# Patient Record
Sex: Female | Born: 1985
Health system: Southern US, Community
[De-identification: ages and names within clinical notes are randomized; demographics above are authoritative.]

## PROBLEM LIST (undated history)

## (undated) DIAGNOSIS — M311 Thrombotic microangiopathy: Secondary | ICD-10-CM

## (undated) DIAGNOSIS — M3119 Other thrombotic microangiopathy: Secondary | ICD-10-CM

## (undated) DIAGNOSIS — D593 Hemolytic-uremic syndrome: Secondary | ICD-10-CM

## (undated) DIAGNOSIS — D500 Iron deficiency anemia secondary to blood loss (chronic): Secondary | ICD-10-CM

## (undated) DIAGNOSIS — I10 Essential (primary) hypertension: Secondary | ICD-10-CM

## (undated) DIAGNOSIS — N289 Disorder of kidney and ureter, unspecified: Secondary | ICD-10-CM

## (undated) DIAGNOSIS — D5932 Hereditary hemolytic-uremic syndrome: Secondary | ICD-10-CM

## (undated) DIAGNOSIS — G9389 Other specified disorders of brain: Secondary | ICD-10-CM

## (undated) HISTORY — DX: Hereditary hemolytic-uremic syndrome: D59.32

## (undated) HISTORY — DX: Hemolytic-uremic syndrome: D59.3

---

## 2013-05-21 ENCOUNTER — Ambulatory Visit: Payer: Medicaid Other | Attending: Family Medicine | Admitting: Internal Medicine

## 2013-05-21 VITALS — BP 142/93 | HR 56 | Temp 98.5°F | Ht 64.0 in | Wt 150.1 lb

## 2013-05-21 DIAGNOSIS — M311 Thrombotic microangiopathy: Secondary | ICD-10-CM

## 2013-05-21 DIAGNOSIS — M3119 Other thrombotic microangiopathy: Secondary | ICD-10-CM | POA: Insufficient documentation

## 2013-05-21 DIAGNOSIS — I10 Essential (primary) hypertension: Secondary | ICD-10-CM | POA: Insufficient documentation

## 2013-05-21 LAB — CBC WITH DIFFERENTIAL/PLATELET
Basophils Absolute: 0 10*3/uL (ref 0.0–0.1)
Basophils Relative: 1 % (ref 0–1)
Hemoglobin: 13.5 g/dL (ref 12.0–15.0)
Lymphocytes Relative: 33 % (ref 12–46)
MCHC: 34.8 g/dL (ref 30.0–36.0)
Monocytes Relative: 7 % (ref 3–12)
Neutro Abs: 3.2 10*3/uL (ref 1.7–7.7)
Neutrophils Relative %: 54 % (ref 43–77)
RDW: 13.2 % (ref 11.5–15.5)
WBC: 5.9 10*3/uL (ref 4.0–10.5)

## 2013-05-21 MED ORDER — CARVEDILOL 12.5 MG PO TABS: 12.5000 mg | ORAL_TABLET | Freq: Two times a day (BID) | ORAL | Status: DC

## 2013-05-21 NOTE — Patient Instructions (Addendum)

## 2013-05-21 NOTE — Progress Notes (Signed)
Patient ID: Jenna Duran, female   DOB: 01-10-1986, 27 y.o.   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 Patient usually gets twice a year CBC check. At this time patient has no current complaints. No chest pain, shortness of breath or palpitations. No headaches. No abdominal pain, nausea or vomiting. No lightheadedness or dizziness or loss of consciousness.  No Known Allergies No past medical history on file. No current outpatient prescriptions on file prior to visit.   No current facility-administered medications on file prior to visit.   Family medical history significant for HTN, HLD  History   Social History  . Marital Status: Significant Other    Spouse Name: N/A    Number of Children: N/A  . Years of Education: N/A   Occupational History  . Not on file.   Social History Main Topics  . Smoking status: Never Smoker   . Smokeless tobacco: Not on file  . Alcohol Use: Not on file  . Drug Use: Not on file  . Sexually Active: Not on file   Other Topics Concern  . Not on file   Social History Narrative  . No narrative on file    Review of Systems  Constitutional: Negative for fever, chills, diaphoresis, activity change, appetite change and fatigue.  HENT: Negative for ear pain, nosebleeds, congestion, facial swelling, rhinorrhea, neck pain, neck stiffness and ear discharge.   Eyes: Negative for pain, discharge, redness, itching and visual disturbance.  Respiratory: Negative for cough, choking, chest tightness, shortness of breath, wheezing and stridor.   Cardiovascular: Negative for chest pain, palpitations and leg swelling.  Gastrointestinal: Negative for abdominal distention.  Genitourinary: Negative for dysuria, urgency, frequency, hematuria, flank pain, decreased urine volume, difficulty urinating and dyspareunia.  Musculoskeletal: Negative for back pain, joint  swelling, arthralgias and gait problem.  Neurological: Negative for dizziness, tremors, seizures, syncope, facial asymmetry, speech difficulty, weakness, light-headedness, numbness and headaches.  Hematological: Negative for adenopathy. Does not bruise/bleed easily.  Psychiatric/Behavioral: Negative for hallucinations, behavioral problems, confusion, dysphoric mood, decreased concentration and agitation.    Objective:   Filed Vitals:   05/21/13 1226  BP: 142/93  Pulse: 56  Temp: 98.5 F (36.9 C)    Physical Exam  Constitutional: Appears well-developed and well-nourished. No distress.  HENT: Normocephalic. External right and left ear normal. Oropharynx is clear and moist.  Eyes: Conjunctivae and EOM are normal. PERRLA, no scleral icterus.  Neck: Normal ROM. Neck supple. No JVD. No tracheal deviation. No thyromegaly.  CVS: RRR, S1/S2 +, no murmurs, no gallops, no carotid bruit.  Pulmonary: Effort and breath sounds normal, no stridor, rhonchi, wheezes, rales.  Abdominal: Soft. BS +,  no distension, tenderness, rebound or guarding.  Musculoskeletal: Normal range of motion. No edema and no tenderness.  Lymphadenopathy: No lymphadenopathy noted, cervical, inguinal. Neuro: Alert. Normal reflexes, muscle tone coordination. No cranial nerve deficit. Skin: Skin is warm and dry. No rash noted. Not diaphoretic. No erythema. No pallor.  Psychiatric: Normal mood and affect. Behavior, judgment, thought content normal.   No results found for this basename: WBC, HGB, HCT, MCV, PLT   No results found for this basename: CREATININE, BUN, NA, K, CL, CO2    No results found for this basename: HGBA1C   Lipid Panel  No results found for this basename: chol, trig, hdl, cholhdl, vldl, ldlcalc       Assessment and plan:   Patient  Active Problem List   Diagnosis Date Noted  . 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 Patient is not on active  treatment for TTP.  Marland Kitchen HTN (hypertension) 05/21/2013    Priority: High - BP 142/93 - Description provided for Coreg 12.5 mg twice daily which is patient's medication on a regular basis

## 2013-10-08 ENCOUNTER — Other Ambulatory Visit: Payer: Self-pay

## 2016-01-09 DIAGNOSIS — R51 Headache: Secondary | ICD-10-CM

## 2016-01-09 DIAGNOSIS — R519 Headache, unspecified: Secondary | ICD-10-CM | POA: Insufficient documentation

## 2016-03-03 ENCOUNTER — Emergency Department (HOSPITAL_COMMUNITY)
Admission: EM | Admit: 2016-03-03 | Discharge: 2016-03-03 | Disposition: A | Discharge status: Home / Self Care | Payer: Medicaid Other | Attending: Emergency Medicine | Admitting: Emergency Medicine

## 2016-03-03 ENCOUNTER — Encounter (HOSPITAL_COMMUNITY): Payer: Self-pay | Admitting: Nurse Practitioner

## 2016-03-03 DIAGNOSIS — I10 Essential (primary) hypertension: Secondary | ICD-10-CM | POA: Insufficient documentation

## 2016-03-03 DIAGNOSIS — Z87448 Personal history of other diseases of urinary system: Secondary | ICD-10-CM | POA: Insufficient documentation

## 2016-03-03 DIAGNOSIS — Z8739 Personal history of other diseases of the musculoskeletal system and connective tissue: Secondary | ICD-10-CM | POA: Insufficient documentation

## 2016-03-03 DIAGNOSIS — IMO0001 Reserved for inherently not codable concepts without codable children: Secondary | ICD-10-CM

## 2016-03-03 DIAGNOSIS — R030 Elevated blood-pressure reading, without diagnosis of hypertension: Secondary | ICD-10-CM

## 2016-03-03 DIAGNOSIS — F17210 Nicotine dependence, cigarettes, uncomplicated: Secondary | ICD-10-CM | POA: Insufficient documentation

## 2016-03-03 DIAGNOSIS — Z79899 Other long term (current) drug therapy: Secondary | ICD-10-CM | POA: Insufficient documentation

## 2016-03-03 HISTORY — DX: Thrombotic microangiopathy: M31.1

## 2016-03-03 HISTORY — DX: Disorder of kidney and ureter, unspecified: N28.9

## 2016-03-03 HISTORY — DX: Essential (primary) hypertension: I10

## 2016-03-03 HISTORY — DX: Other thrombotic microangiopathy: M31.19

## 2016-03-03 MED ORDER — LABETALOL HCL 300 MG PO TABS: 300.0000 mg | ORAL_TABLET | Freq: Two times a day (BID) | ORAL | Status: DC

## 2016-03-03 MED ORDER — CLONIDINE HCL 0.2 MG PO TABS
0.2000 mg | ORAL_TABLET | Freq: Once | ORAL | Status: AC
  Administered 2016-03-03: 0.2 mg via ORAL
  Filled 2016-03-03: qty 1

## 2016-03-03 MED ORDER — AMLODIPINE BESYLATE 5 MG PO TABS
10.0000 mg | ORAL_TABLET | Freq: Once | ORAL | Status: AC
  Administered 2016-03-03: 10 mg via ORAL
  Filled 2016-03-03: qty 2

## 2016-03-03 MED ORDER — LABETALOL HCL 200 MG PO TABS
300.0000 mg | ORAL_TABLET | Freq: Once | ORAL | Status: AC
  Administered 2016-03-03: 300 mg via ORAL
  Filled 2016-03-03: qty 2

## 2016-03-03 MED ORDER — CLONIDINE HCL 0.2 MG PO TABS: 0.2000 mg | ORAL_TABLET | Freq: Three times a day (TID) | ORAL | Status: DC

## 2016-03-03 MED ORDER — AMLODIPINE BESYLATE 10 MG PO TABS: 10.0000 mg | ORAL_TABLET | Freq: Every day | ORAL | Status: DC

## 2016-03-03 NOTE — ED Notes (Signed)
Pt is in stable condition upon d/c and ambulates from ED. 

## 2016-03-03 NOTE — Discharge Instructions (Signed)
Hypertension Hypertension, commonly called high blood pressure, is when the force of blood pumping through your arteries is too strong. Your arteries are the blood vessels that carry blood from your heart throughout your body. A blood pressure reading consists of a higher number over a lower number, such as 110/72. The higher number (systolic) is the pressure inside your arteries when your heart pumps. The lower number (diastolic) is the pressure inside your arteries when your heart relaxes. Ideally you want your blood pressure below 120/80. Hypertension forces your heart to work harder to pump blood. Your arteries may become narrow or stiff. Having untreated or uncontrolled hypertension can cause heart attack, stroke, kidney disease, and other problems. RISK FACTORS Some risk factors for high blood pressure are controllable. Others are not.  Risk factors you cannot control include:   Race. You may be at higher risk if you are African American.  Age. Risk increases with age.  Gender. Men are at higher risk than women before age 45 years. After age 65, women are at higher risk than men. Risk factors you can control include:  Not getting enough exercise or physical activity.  Being overweight.  Getting too much fat, sugar, calories, or salt in your diet.  Drinking too much alcohol. SIGNS AND SYMPTOMS Hypertension does not usually cause signs or symptoms. Extremely high blood pressure (hypertensive crisis) may cause headache, anxiety, shortness of breath, and nosebleed. DIAGNOSIS To check if you have hypertension, your health care provider will measure your blood pressure while you are seated, with your arm held at the level of your heart. It should be measured at least twice using the same arm. Certain conditions can cause a difference in blood pressure between your right and left arms. A blood pressure reading that is higher than normal on one occasion does not mean that you need treatment. If  it is not clear whether you have high blood pressure, you may be asked to return on a different day to have your blood pressure checked again. Or, you may be asked to monitor your blood pressure at home for 1 or more weeks. TREATMENT Treating high blood pressure includes making lifestyle changes and possibly taking medicine. Living a healthy lifestyle can help lower high blood pressure. You may need to change some of your habits. Lifestyle changes may include:  Following the DASH diet. This diet is high in fruits, vegetables, and whole grains. It is low in salt, red meat, and added sugars.  Keep your sodium intake below 2,300 mg per day.  Getting at least 30-45 minutes of aerobic exercise at least 4 times per week.  Losing weight if necessary.  Not smoking.  Limiting alcoholic beverages.  Learning ways to reduce stress. Your health care provider may prescribe medicine if lifestyle changes are not enough to get your blood pressure under control, and if one of the following is true:  You are 18-59 years of age and your systolic blood pressure is above 140.  You are 60 years of age or older, and your systolic blood pressure is above 150.  Your diastolic blood pressure is above 90.  You have diabetes, and your systolic blood pressure is over 140 or your diastolic blood pressure is over 90.  You have kidney disease and your blood pressure is above 140/90.  You have heart disease and your blood pressure is above 140/90. Your personal target blood pressure may vary depending on your medical conditions, your age, and other factors. HOME CARE INSTRUCTIONS    Have your blood pressure rechecked as directed by your health care provider.   Take medicines only as directed by your health care provider. Follow the directions carefully. Blood pressure medicines must be taken as prescribed. The medicine does not work as well when you skip doses. Skipping doses also puts you at risk for  problems.  Do not smoke.   Monitor your blood pressure at home as directed by your health care provider. SEEK MEDICAL CARE IF:   You think you are having a reaction to medicines taken.  You have recurrent headaches or feel dizzy.  You have swelling in your ankles.  You have trouble with your vision. SEEK IMMEDIATE MEDICAL CARE IF:  You develop a severe headache or confusion.  You have unusual weakness, numbness, or feel faint.  You have severe chest or abdominal pain.  You vomit repeatedly.  You have trouble breathing. MAKE SURE YOU:   Understand these instructions.  Will watch your condition.  Will get help right away if you are not doing well or get worse.   This information is not intended to replace advice given to you by your health care provider. Make sure you discuss any questions you have with your health care provider.   Document Released: 11/19/2005 Document Revised: 04/05/2015 Document Reviewed: 09/11/2013 Elsevier Interactive Patient Education 2016 Elsevier Inc.  

## 2016-03-03 NOTE — ED Provider Notes (Signed)
CSN: [id number]     Arrival date & time 03/03/16  1157 History   First MD Initiated Contact with Patient 03/03/16 1226     Chief Complaint  Patient presents with  . Hypertension     (Consider location/radiation/quality/duration/timing/severity/associated sxs/prior Treatment) HPI   Jenna Duran is a 30 y.o. female with PMH significant for HTN who presents with elevated BP because she has been out of her BP medications for the past couple days.  She reports she is currently in the process of getting Medicaid, and has not been able to follow up with a PCP.  She states she takes 0.2 mg Clonidine TID, Labetalol 300 mg BID, and Amlodipine 10 mg QD.  She reports generalized mild headache with gradual onset since being out of her medications.  Denies thunderclap headache. Denies CP, SOB, abdominal pain, syncope, or weakness.    Past Medical History  Diagnosis Date  . Hypertension   . Renal disorder   . T.T.P. syndrome ([place])    No past surgical history on file. No family history on file. Social History  Substance Use Topics  . Smoking status: Current Every Day Smoker    Types: Cigarettes  . Smokeless tobacco: Not on file  . Alcohol Use: No   OB History    No data available     Review of Systems All other systems negative unless otherwise stated in HPI    Allergies  Review of patient's allergies indicates no known allergies.  Home Medications   Prior to Admission medications   Medication Sig Start Date End Date Taking? Authorizing Provider  carvedilol (COREG) 12.5 MG tablet Take 1 tablet (12.5 mg total) by mouth 2 (two) times daily with a meal. 05/21/13   Robbie Lis, MD   BP 155/100 mmHg  Pulse 82  Temp(Src) 97.4 F (36.3 C) (Oral)  Resp 16  SpO2 100%  LMP 02/26/2016 Physical Exam  Constitutional: She is oriented to person, place, and time. She appears well-developed and well-nourished.  Non-toxic appearance. She does not have a sickly appearance. She does not appear  ill.  HENT:  Head: Normocephalic and atraumatic.  Mouth/Throat: Oropharynx is clear and moist.  Eyes: Conjunctivae are normal. Pupils are equal, round, and reactive to light.  Neck: Normal range of motion. Neck supple.  Cardiovascular: Normal rate, regular rhythm and normal heart sounds.   No murmur heard. Pulmonary/Chest: Effort normal and breath sounds normal. No accessory muscle usage or stridor. No respiratory distress. She has no wheezes. She has no rhonchi. She has no rales.  Abdominal: Soft. Bowel sounds are normal. She exhibits no distension. There is no tenderness.  Musculoskeletal: Normal range of motion.  Lymphadenopathy:    She has no cervical adenopathy.  Neurological: She is alert and oriented to person, place, and time.  Mental Status:   AOx3.  Speech clear without dysarthria. Cranial Nerves:  I-not tested  II-PERRLA  III, IV, VI-EOMs intact  V-temporal and masseter strength intact  VII-symmetrical facial movements intact, no facial droop  VIII-hearing grossly intact bilaterally  IX, X-gag intact  XI-strength of sternomastoid and trapezius muscles 5/5  XII-tongue midline Motor:   Good muscle bulk and tone  Strength 5/5 bilaterally in upper and lower extremities   Cerebellar--intact RAMs, finger to nose intact   No pronator drift Sensory:  Intact in upper and lower extremities   Skin: Skin is warm and dry.  Psychiatric: She has a normal mood and affect. Her behavior is normal.    ED  Course  Procedures (including critical care time) Labs Review Labs Reviewed - No data to display  Imaging Review No results found. I have personally reviewed and evaluated these images and lab results as part of my medical decision-making.   EKG Interpretation None      MDM   Final diagnoses:  Essential hypertension  Elevated BP   Patient presents for medication refill for HTN currently taking Clonidine, Labetalol, and Amlodipine.  On arrival, BP elevated 155/100.   Denies severe HA, CP, SOB, or abdominal pain.  Normal neuro exam.  Doubt ACS, SAH, or other hypertensive emergency. Patient given AM doses in ED.  BP improved to 137/96.  Per ACEP guidelines, no indication for emergent intervention at this time.  Plan to refill prescriptions and given follow up with [facility].  Discussed return precautions.  Patient agrees and acknowledges the above plan for discharge.   Case has been discussed with Dr. Audie Pinto who agrees with the above plan for discharge.      Gloriann Loan, PA-C 03/03/16 [place], MD 03/08/16 [phone]

## 2016-03-03 NOTE — ED Notes (Signed)
She reports she is out of her BP medication due to financial issues and this am her BP was elevated and she doesn't feel well and has had a headache. She is A&Ox4, resp e/u

## 2016-03-03 NOTE — ED Notes (Signed)
Pt states she is only here for a medication refill on her BP meds. Pt states she doesn't have a PCP and needs a 30 day supply until she can establish primary care.

## 2016-04-18 ENCOUNTER — Encounter (HOSPITAL_COMMUNITY): Payer: Self-pay | Admitting: Emergency Medicine

## 2016-04-18 ENCOUNTER — Emergency Department (HOSPITAL_COMMUNITY): Payer: Medicaid Other

## 2016-04-18 ENCOUNTER — Emergency Department (HOSPITAL_COMMUNITY)
Admission: EM | Admit: 2016-04-18 | Discharge: 2016-04-18 | Disposition: A | Discharge status: Home / Self Care | Payer: Medicaid Other | Attending: Emergency Medicine | Admitting: Emergency Medicine

## 2016-04-18 DIAGNOSIS — R10819 Abdominal tenderness, unspecified site: Secondary | ICD-10-CM | POA: Insufficient documentation

## 2016-04-18 DIAGNOSIS — Z760 Encounter for issue of repeat prescription: Secondary | ICD-10-CM | POA: Insufficient documentation

## 2016-04-18 DIAGNOSIS — I159 Secondary hypertension, unspecified: Secondary | ICD-10-CM

## 2016-04-18 DIAGNOSIS — R0789 Other chest pain: Secondary | ICD-10-CM | POA: Insufficient documentation

## 2016-04-18 DIAGNOSIS — Z79899 Other long term (current) drug therapy: Secondary | ICD-10-CM | POA: Insufficient documentation

## 2016-04-18 DIAGNOSIS — F17210 Nicotine dependence, cigarettes, uncomplicated: Secondary | ICD-10-CM | POA: Insufficient documentation

## 2016-04-18 MED ORDER — CLONIDINE HCL 0.2 MG PO TABS
0.2000 mg | ORAL_TABLET | Freq: Once | ORAL | Status: AC
  Administered 2016-04-18: 0.2 mg via ORAL
  Filled 2016-04-18: qty 1

## 2016-04-18 MED ORDER — CLONIDINE HCL 0.2 MG PO TABS: 0.2000 mg | ORAL_TABLET | Freq: Three times a day (TID) | ORAL | Status: DC

## 2016-04-18 MED ORDER — LABETALOL HCL 200 MG PO TABS
300.0000 mg | ORAL_TABLET | Freq: Once | ORAL | Status: AC
  Administered 2016-04-18: 300 mg via ORAL
  Filled 2016-04-18: qty 2

## 2016-04-18 MED ORDER — LABETALOL HCL 300 MG PO TABS: 300.0000 mg | ORAL_TABLET | Freq: Two times a day (BID) | ORAL | Status: DC

## 2016-04-18 NOTE — ED Notes (Addendum)
Pt reports she ran out of her 0.2mg  clonidine and 300mg  labetelol yesterday. Pt reports hx of HTn. States this Am at 2am woke up with substernal chest pain and tightness feeling like she couldn't breathe. States chest pain has decreased but still feels tightness and has a headache. Pt awake, alert, oriented x4, NAD at present. Lungs CTA.

## 2016-04-18 NOTE — Discharge Instructions (Signed)
Take 4 over the counter ibuprofen tablets 3 times a day or 2 over-the-counter naproxen tablets twice a day for pain. Try tylenol 1-2 caps 4 times a day.  Chest Wall Pain Chest wall pain is pain in or around the bones and muscles of your chest. Sometimes, an injury causes this pain. Sometimes, the cause may not be known. This pain may take several weeks or longer to get better. HOME CARE Pay attention to any changes in your symptoms. Take these actions to help with your pain:  Rest as told by your doctor.  Avoid activities that cause pain. Try not to use your chest, belly (abdominal), or side muscles to lift heavy things.  If directed, apply ice to the painful area:  Put ice in a plastic bag.  Place a towel between your skin and the bag.  Leave the ice on for 20 minutes, 2-3 times per day.  Take over-the-counter and prescription medicines only as told by your doctor.  Do not use tobacco products, including cigarettes, chewing tobacco, and e-cigarettes. If you need help quitting, ask your doctor.  Keep all follow-up visits as told by your doctor. This is important. GET HELP IF:  You have a fever.  Your chest pain gets worse.  You have new symptoms. GET HELP RIGHT AWAY IF:  You feel sick to your stomach (nauseous) or you throw up (vomit).  You feel sweaty or light-headed.  You have a cough with phlegm (sputum) or you cough up blood.  You are short of breath.   This information is not intended to replace advice given to you by your health care provider. Make sure you discuss any questions you have with your health care provider.   Document Released: 05/07/2008 Document Revised: 08/10/2015 Document Reviewed: 02/14/2015 Elsevier Interactive Patient Education [employer].

## 2016-04-18 NOTE — ED Provider Notes (Signed)
CSN: [id number]     Arrival date & time 04/18/16  0845 History   First MD Initiated Contact with Patient 04/18/16 [phone]     Chief Complaint  Patient presents with  . Medication Refill  . Chest Pain     (Consider location/radiation/quality/duration/timing/severity/associated sxs/prior Treatment) Patient is a 30 y.o. female presenting with chest pain. The history is provided by the patient.  Chest Pain Pain location:  R chest and R lateral chest Pain quality: dull and pressure   Pain radiates to:  Does not radiate Pain severity:  Mild Onset quality:  Gradual Duration:  2 days Timing:  Constant Progression:  Unchanged Chronicity:  New Relieved by:  Certain positions Worsened by:  Deep breathing, movement and certain positions Ineffective treatments:  None tried Associated symptoms: no dizziness, no fever, no headache, no nausea, no palpitations, no shortness of breath and not vomiting   Risk factors: hypertension    30 yo F With a chief complaint of chest pain. This is to her right lateral chest wall worse with movement or palpation. Going on since last night. Patient is concerned because it coincides with when she ran out of her home blood pressure medications. She is currently working on establishing with the family doctor however is waiting for her social worker to get her insurance approved. Denies exertional symptoms. Denies relation with food. Denies cough congestion fevers. Denies PE risk factors.  Past Medical History  Diagnosis Date  . Hypertension   . Renal disorder   . T.T.P. syndrome ([place])    History reviewed. No pertinent past surgical history. History reviewed. No pertinent family history. Social History  Substance Use Topics  . Smoking status: Current Every Day Smoker    Types: Cigarettes  . Smokeless tobacco: None  . Alcohol Use: No   OB History    No data available     Review of Systems  Constitutional: Negative for fever and chills.  HENT: Negative for  congestion and rhinorrhea.   Eyes: Negative for redness and visual disturbance.  Respiratory: Negative for shortness of breath and wheezing.   Cardiovascular: Positive for chest pain. Negative for palpitations.  Gastrointestinal: Negative for nausea and vomiting.  Genitourinary: Negative for dysuria and urgency.  Musculoskeletal: Negative for myalgias and arthralgias.  Skin: Negative for pallor and wound.  Neurological: Negative for dizziness and headaches.      Allergies  Review of patient's allergies indicates no known allergies.  Home Medications   Prior to Admission medications   Medication Sig Start Date End Date Taking? Authorizing Provider  amLODipine (NORVASC) 10 MG tablet Take 1 tablet (10 mg total) by mouth daily. 03/03/16  Yes Kayla Rose, PA-C  carvedilol (COREG) 12.5 MG tablet Take 1 tablet (12.5 mg total) by mouth 2 (two) times daily with a meal. 05/21/13  Yes Robbie Lis, MD  cloNIDine (CATAPRES) 0.2 MG tablet Take 1 tablet (0.2 mg total) by mouth 3 (three) times daily. 04/18/16   Deno Etienne, DO  labetalol (NORMODYNE) 300 MG tablet Take 1 tablet (300 mg total) by mouth 2 (two) times daily. 04/18/16   Deno Etienne, DO   BP 172/118 mmHg  Pulse 62  Temp(Src) 98.5 F (36.9 C) (Oral)  Resp 16  SpO2 100%  LMP 04/02/2016 Physical Exam  Constitutional: She is oriented to person, place, and time. She appears well-developed and well-nourished. No distress.  HENT:  Head: Normocephalic and atraumatic.  Eyes: EOM are normal. Pupils are equal, round, and reactive to light.  Neck:  Normal range of motion. Neck supple.  Cardiovascular: Normal rate and regular rhythm.  Exam reveals no gallop and no friction rub.   No murmur heard. Pulmonary/Chest: Effort normal. She has no wheezes. She has no rales. She exhibits tenderness (tender palpation about the right anterior lateral chest wall. Some mild tenderness in the upper portion of the abdomen. Negative Murphy sign).  Abdominal: Soft. She  exhibits no distension. There is tenderness. There is no rebound and no guarding.  Musculoskeletal: She exhibits no edema or tenderness.  Neurological: She is alert and oriented to person, place, and time.  Skin: Skin is warm and dry. She is not diaphoretic.  Psychiatric: She has a normal mood and affect. Her behavior is normal.  Nursing note and vitals reviewed.   ED Course  Procedures (including critical care time) Labs Review Labs Reviewed - No data to display  Imaging Review Dg Chest 2 View  04/18/2016  CLINICAL DATA:  Chest pain. EXAM: CHEST  2 VIEW COMPARISON:  None. FINDINGS: Normal heart size and mediastinal contours. No acute infiltrate or edema. No effusion or pneumothorax. No acute osseous findings. IMPRESSION: Negative chest. Electronically Signed   By: Monte Fantasia M.D.   On: 04/18/2016 09:27   I have personally reviewed and evaluated these images and lab results as part of my medical decision-making.   EKG Interpretation   Date/Time:  Wednesday Apr 18 2016 08:52:45 EDT Ventricular Rate:  78 PR Interval:  138 QRS Duration: 84 QT Interval:  393 QTC Calculation: 448 R Axis:   74 Text Interpretation:  Sinus rhythm Right atrial enlargement No old tracing  to compare Confirmed by Jill Stopka MD, DANIEL [phone]) on 04/18/2016 9:05:15 AM  Also confirmed by Tyrone Nine MD, DANIEL [id number]), editor [place] CT, Leda Gauze  [phone])  on 04/18/2016 9:16:55 AM      MDM   Final diagnoses:  Chest wall pain  Secondary hypertension, unspecified    30 yo F with a chief complaint of right-sided chest wall pain. Completely atypical for ACS. Pain with palpation on the chest wall worse with twisting. In my conversation with the patient she seems really more concerned about repeat prescription for her blood pressure medication. We'll give her a dose now prescribe her a 30 day course. Given follow-[facility] with [facility]    I have discussed the  diagnosis/risks/treatment options with the patient and family and believe the pt to be eligible for discharge home to follow-up with PCP. We also discussed returning to the ED immediately if new or worsening sx occur. We discussed the sx which are most concerning (e.g., sudden worsening pain, fever, inability to tolerate by mouth) that necessitate immediate return. Medications administered to the patient during their visit and any new prescriptions provided to the patient are listed below.  Medications given during this visit Medications  cloNIDine (CATAPRES) tablet 0.2 mg (0.2 mg Oral Given 04/18/16 0931)  labetalol (NORMODYNE) tablet 300 mg (300 mg Oral Given 04/18/16 0931)    Discharge Medication List as of 04/18/2016  9:52 AM      The patient appears reasonably screen and/or stabilized for discharge and I doubt any other medical condition or other [facility] requiring further screening, evaluation, or treatment in the ED at this time prior to discharge.    Deno Etienne, DO 04/18/16 1541

## 2016-06-26 ENCOUNTER — Emergency Department (HOSPITAL_COMMUNITY)
Admission: EM | Admit: 2016-06-26 | Discharge: 2016-06-26 | Disposition: A | Discharge status: Home / Self Care | Payer: Medicaid Other | Attending: Emergency Medicine | Admitting: Emergency Medicine

## 2016-06-26 ENCOUNTER — Encounter (HOSPITAL_COMMUNITY): Payer: Self-pay | Admitting: Emergency Medicine

## 2016-06-26 DIAGNOSIS — I10 Essential (primary) hypertension: Secondary | ICD-10-CM | POA: Insufficient documentation

## 2016-06-26 DIAGNOSIS — Z79899 Other long term (current) drug therapy: Secondary | ICD-10-CM | POA: Insufficient documentation

## 2016-06-26 DIAGNOSIS — F17210 Nicotine dependence, cigarettes, uncomplicated: Secondary | ICD-10-CM | POA: Insufficient documentation

## 2016-06-26 DIAGNOSIS — Z760 Encounter for issue of repeat prescription: Secondary | ICD-10-CM | POA: Insufficient documentation

## 2016-06-26 MED ORDER — CLONIDINE HCL 0.2 MG PO TABS: 0.2000 mg | ORAL_TABLET | Freq: Two times a day (BID) | ORAL | 0 refills | Status: DC

## 2016-06-26 MED ORDER — AMLODIPINE BESYLATE 10 MG PO TABS: 10.0000 mg | ORAL_TABLET | Freq: Every day | ORAL | 0 refills | Status: DC

## 2016-06-26 MED ORDER — CLONIDINE HCL 0.1 MG PO TABS: 0.2000 mg | ORAL_TABLET | Freq: Three times a day (TID) | ORAL | Status: DC

## 2016-06-26 MED ORDER — LABETALOL HCL 200 MG PO TABS
300.0000 mg | ORAL_TABLET | Freq: Two times a day (BID) | ORAL | Status: DC
  Administered 2016-06-26: 300 mg via ORAL
  Filled 2016-06-26: qty 2

## 2016-06-26 MED ORDER — LABETALOL HCL 300 MG PO TABS: 300.0000 mg | ORAL_TABLET | Freq: Two times a day (BID) | ORAL | 0 refills | Status: DC

## 2016-06-26 MED ORDER — CLONIDINE HCL 0.1 MG PO TABS
0.2000 mg | ORAL_TABLET | Freq: Once | ORAL | Status: AC
  Administered 2016-06-26: 0.2 mg via ORAL
  Filled 2016-06-26: qty 2

## 2016-06-26 MED ORDER — AMLODIPINE BESYLATE 5 MG PO TABS
10.0000 mg | ORAL_TABLET | Freq: Every day | ORAL | Status: DC
  Administered 2016-06-26: 10 mg via ORAL
  Filled 2016-06-26: qty 2

## 2016-06-26 NOTE — ED Provider Notes (Signed)
[facility] DEPT Provider Note   CSN: [id number] Arrival date & time: 06/26/16  1339  First Provider Contact:  None    By signing my name below, I, Hansel Feinstein, attest that this documentation has been prepared under the direction and in the presence of Montine Circle, PA-C. Electronically Signed: Hansel Feinstein, ED Scribe. 06/26/16. 2:49 PM.    History   Chief Complaint Chief Complaint  Patient presents with  . Medication Refill    HPI Jenna Duran is a 30 y.o. female who presents to the Emergency Department requesting refill of 0.2 mg Clonidine, 300 mg lisinopril and 25 mg Amlodipine, which she ran out of one month ago. Per chart review, pt was most recently prescribed 0.2 mg Clonidine, 300 mg Labetalol and 10 mg Amlodipine. Pt states she has been having intermittent HAs her BP has been elevated since running out of her medication. Pt states she currently has a HA, which is worsened with bright light. She denies CP, SOB, numbness or weakness to the extremities.   The history is provided by the patient. No language interpreter was used.    Past Medical History:  Diagnosis Date  . Hypertension   . Renal disorder   . 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 HTN (hypertension) 05/21/2013    History reviewed. No pertinent surgical history.  OB History    No data available       Home Medications    Prior to Admission medications   Medication Sig Start Date End Date Taking? Authorizing Provider  amLODipine (NORVASC) 10 MG tablet Take 1 tablet (10 mg total) by mouth daily. 03/03/16   Gloriann Loan, PA-C  carvedilol (COREG) 12.5 MG tablet Take 1 tablet (12.5 mg total) by mouth 2 (two) times daily with a meal. 05/21/13   Robbie Lis, MD  cloNIDine (CATAPRES) 0.2 MG tablet Take 1 tablet (0.2 mg total) by mouth 3 (three) times daily. 04/18/16   Deno Etienne, DO  labetalol  (NORMODYNE) 300 MG tablet Take 1 tablet (300 mg total) by mouth 2 (two) times daily. 04/18/16   Deno Etienne, DO    Family History History reviewed. No pertinent family history.  Social History Social History  Substance Use Topics  . Smoking status: Current Every Day Smoker    Types: Cigarettes  . Smokeless tobacco: Not on file  . Alcohol use No     Allergies   Review of patient's allergies indicates no known allergies.   Review of Systems Review of Systems  Respiratory: Negative for shortness of breath.   Cardiovascular: Negative for chest pain.  Neurological: Positive for headaches. Negative for weakness and numbness.     Physical Exam Updated Vital Signs BP (!) 190/115 (BP Location: Right Arm)   Pulse (!) 59   Temp 98.1 F (36.7 C) (Oral)   Resp 16   Ht 5\' 5"  (1.651 m)   Wt 145 lb (65.8 kg)   SpO2 100%   BMI 24.13 kg/m   Physical Exam Physical Exam  Constitutional: Pt is oriented to person, place, and time. Pt appears well-developed and well-nourished. No distress.  HENT:  Head: Normocephalic and atraumatic.  Mouth/Throat: Oropharynx is clear and moist.  Eyes: Conjunctivae and EOM are normal. Pupils are equal, round, and reactive to light. No scleral icterus.  No horizontal, vertical or rotational nystagmus  Neck: Normal range of motion. Neck supple.  Full  active and passive ROM without pain No midline or paraspinal tenderness No nuchal rigidity or meningeal signs  Cardiovascular: Normal rate, regular rhythm and intact distal pulses.   Pulmonary/Chest: Effort normal and breath sounds normal. No respiratory distress. Pt has no wheezes. No rales.  Abdominal: Soft. Bowel sounds are normal. There is no tenderness. There is no rebound and no guarding.  Musculoskeletal: Normal range of motion.  Lymphadenopathy:    No cervical adenopathy.  Neurological: Pt. is alert and oriented to person, place, and time. He has normal reflexes. No cranial nerve deficit.  Exhibits  normal muscle tone. Coordination normal.  Mental Status:  Alert, oriented, thought content appropriate. Speech fluent without evidence of aphasia. Able to follow 2 step commands without difficulty.  Cranial Nerves:  II:  Peripheral visual fields grossly normal, pupils equal, round, reactive to light III,IV, VI: ptosis not present, extra-ocular motions intact bilaterally  V,VII: smile symmetric, facial light touch sensation equal VIII: hearing grossly normal bilaterally  IX,X: midline uvula rise  XI: bilateral shoulder shrug equal and strong XII: midline tongue extension  Motor:  5/5 in upper and lower extremities bilaterally including strong and equal grip strength and dorsiflexion/plantar flexion Cerebellar: movements are goal-oriented.  Gait: normal gait and balance CV: distal pulses palpable throughout   Skin: Skin is warm and dry. No rash noted. Pt is not diaphoretic.  Psychiatric: Pt has a normal mood and affect. Behavior is normal. Judgment and thought content normal.  Nursing note and vitals reviewed.    ED Treatments / Results   Procedures Procedures (including critical care time)  DIAGNOSTIC STUDIES: Oxygen Saturation is 100% on RA, normal by my interpretation.    COORDINATION OF CARE: 2:43 PM Discussed treatment plan with pt at bedside which includes medication refill and pt agreed to plan.    Medications Ordered in ED Medications - No data to display   Initial Impression / Assessment and Plan / ED Course  I have reviewed the triage vital signs and the nursing notes.  Pertinent labs & imaging results that were available during my care of the patient were reviewed by me and considered in my medical decision making (see chart for details).  Clinical Course    Patient here for medication refill.  Ran out of BP meds 1 month ago.  Working on getting a new PCP.  Will give dose of meds here and refill.  Signed out to [employer].  Plan, discharge if BP improving and  headache resolving.  Final Clinical Impressions(s) / ED Diagnoses   Final diagnoses:  Medication refill  Benign essential HTN    New Prescriptions Discharge Medication List as of 06/26/2016  5:02 PM       I personally performed the services described in this documentation, which was scribed in my presence. The recorded information has been reviewed and is accurate.      Montine Circle, PA-C 06/27/16 [id number]    Virgel Manifold, MD 06/30/16 [phone]

## 2016-06-26 NOTE — ED Notes (Signed)
PA in to see pt. 

## 2016-06-26 NOTE — ED Provider Notes (Signed)
30 year old female who presents with medication refill. Associated with HA. Assumed care from R. Browning PA-C at shift change. Patient received her HTN meds at ~4:30pm. Will recheck BP in about 30 minutes.  4:55PM: Vital signs rechecked - BP is now 149/102 and pt reports HA is improving. Will d/c with prescriptions. Patient states she has an appointment to establish care with PCP. Patient is NAD, non-toxic, with stable VS. Patient is informed of clinical course, understands medical decision making process, and agrees with plan. Opportunity for questions provided and all questions answered. Return precautions given.    Recardo Evangelist, PA-C 06/26/16 1701    Malvin Johns, MD 06/26/16 [phone]

## 2016-06-26 NOTE — ED Triage Notes (Signed)
Pt here for refill of htn meds; pt sts out x 1 month and having some HAs

## 2017-07-01 ENCOUNTER — Emergency Department (HOSPITAL_COMMUNITY): Payer: Self-pay

## 2017-07-01 ENCOUNTER — Encounter (HOSPITAL_COMMUNITY): Payer: Self-pay | Admitting: Emergency Medicine

## 2017-07-01 ENCOUNTER — Emergency Department (HOSPITAL_COMMUNITY)
Admission: EM | Admit: 2017-07-01 | Discharge: 2017-07-02 | Disposition: A | Discharge status: Home / Self Care | Payer: Self-pay | Attending: Emergency Medicine | Admitting: Emergency Medicine

## 2017-07-01 DIAGNOSIS — I10 Essential (primary) hypertension: Secondary | ICD-10-CM | POA: Insufficient documentation

## 2017-07-01 DIAGNOSIS — Z79899 Other long term (current) drug therapy: Secondary | ICD-10-CM | POA: Insufficient documentation

## 2017-07-01 DIAGNOSIS — R002 Palpitations: Secondary | ICD-10-CM | POA: Insufficient documentation

## 2017-07-01 DIAGNOSIS — F17210 Nicotine dependence, cigarettes, uncomplicated: Secondary | ICD-10-CM | POA: Insufficient documentation

## 2017-07-01 LAB — BASIC METABOLIC PANEL
ANION GAP: 10 (ref 5–15)
BUN: 30 mg/dL — AB (ref 6–20)
CO2: 26 mmol/L (ref 22–32)
Calcium: 8.6 mg/dL — ABNORMAL LOW (ref 8.9–10.3)
Chloride: 103 mmol/L (ref 101–111)
Creatinine, Ser: 2.8 mg/dL — ABNORMAL HIGH (ref 0.44–1.00)
GFR calc Af Amer: 25 mL/min — ABNORMAL LOW (ref >60)
GFR calc non Af Amer: 21 mL/min — ABNORMAL LOW (ref >60)
GLUCOSE: 148 mg/dL — AB (ref 65–99)
POTASSIUM: 3.4 mmol/L — AB (ref 3.5–5.1)
Sodium: 139 mmol/L (ref 135–145)

## 2017-07-01 LAB — URINALYSIS, ROUTINE W REFLEX MICROSCOPIC
BILIRUBIN URINE: NEGATIVE
Glucose, UA: 50 mg/dL — AB
KETONES UR: NEGATIVE mg/dL
Leukocytes, UA: NEGATIVE
NITRITE: NEGATIVE
Protein, ur: >=300 mg/dL — AB
Specific Gravity, Urine: 1.013 (ref 1.005–1.030)
pH: 7 (ref 5.0–8.0)

## 2017-07-01 LAB — CBC
HCT: 30.3 % — ABNORMAL LOW (ref 36.0–46.0)
HEMOGLOBIN: 10.4 g/dL — AB (ref 12.0–15.0)
MCH: 30.8 pg (ref 26.0–34.0)
MCHC: 34.3 g/dL (ref 30.0–36.0)
MCV: 89.6 fL (ref 78.0–100.0)
Platelets: 142 10*3/uL — ABNORMAL LOW (ref 150–400)
RBC: 3.38 MIL/uL — ABNORMAL LOW (ref 3.87–5.11)
RDW: 14.3 % (ref 11.5–15.5)
WBC: 6.6 10*3/uL (ref 4.0–10.5)

## 2017-07-01 LAB — I-STAT TROPONIN, ED: Troponin i, poc: 0.04 ng/mL (ref 0.00–0.08)

## 2017-07-01 LAB — LIPASE, BLOOD: Lipase: 35 U/L (ref 11–51)

## 2017-07-01 MED ORDER — LOSARTAN POTASSIUM 100 MG PO TABS: 100.0000 mg | ORAL_TABLET | Freq: Every day | ORAL | 1 refills | Status: DC

## 2017-07-01 MED ORDER — DIPHENHYDRAMINE HCL 50 MG/ML IJ SOLN
25.0000 mg | Freq: Once | INTRAMUSCULAR | Status: AC
  Administered 2017-07-01: 25 mg via INTRAVENOUS
  Filled 2017-07-01: qty 1

## 2017-07-01 MED ORDER — SODIUM CHLORIDE 0.9 % IV BOLUS (SEPSIS)
1000.0000 mL | Freq: Once | INTRAVENOUS | Status: AC
  Administered 2017-07-01: 1000 mL via INTRAVENOUS

## 2017-07-01 MED ORDER — LOSARTAN POTASSIUM 50 MG PO TABS
50.0000 mg | ORAL_TABLET | Freq: Once | ORAL | Status: AC
  Administered 2017-07-02: 50 mg via ORAL
  Filled 2017-07-01: qty 1

## 2017-07-01 MED ORDER — MAGNESIUM SULFATE 2 GM/50ML IV SOLN
2.0000 g | Freq: Once | INTRAVENOUS | Status: AC
  Administered 2017-07-02: 2 g via INTRAVENOUS
  Filled 2017-07-01: qty 50

## 2017-07-01 MED ORDER — DEXAMETHASONE SODIUM PHOSPHATE 10 MG/ML IJ SOLN
10.0000 mg | Freq: Once | INTRAMUSCULAR | Status: AC
Start: 2017-07-01 — End: 2017-07-01
  Administered 2017-07-01: 10 mg via INTRAVENOUS
  Filled 2017-07-01: qty 1

## 2017-07-01 MED ORDER — METOCLOPRAMIDE HCL 5 MG/ML IJ SOLN
10.0000 mg | Freq: Once | INTRAMUSCULAR | Status: AC
Start: 2017-07-01 — End: 2017-07-01
  Administered 2017-07-01: 10 mg via INTRAVENOUS
  Filled 2017-07-01: qty 2

## 2017-07-01 MED ORDER — LOSARTAN POTASSIUM 50 MG PO TABS
50.0000 mg | ORAL_TABLET | Freq: Once | ORAL | Status: AC
  Administered 2017-07-01: 50 mg via ORAL
  Filled 2017-07-01: qty 1

## 2017-07-01 MED ORDER — METOCLOPRAMIDE HCL 10 MG PO TABS: 10.0000 mg | ORAL_TABLET | Freq: Four times a day (QID) | ORAL | 0 refills | Status: DC | PRN

## 2017-07-01 NOTE — ED Triage Notes (Signed)
Pt to ER for "flare up of TTP." states she has been having migraines x1 week with palpitations, chest pain, and abdominal pain. Also states bruising to bilateral lower extremities over the past week without injury. Pt is in NAD, a/o x4.

## 2017-07-01 NOTE — ED Notes (Signed)
Pt up to desk requesting update, provided answers to questions and apologized for wait. Explained acuity. Informed pt that her name has been moved to a treatment room and staff will come and get her shortly.

## 2017-07-01 NOTE — ED Notes (Signed)
Patient transported to CT 

## 2017-07-01 NOTE — ED Notes (Signed)
Pt ambulatory to restroom

## 2017-07-02 NOTE — ED Provider Notes (Signed)
[facility] DEPT Provider Note   CSN: [id number] Arrival date & time: 07/01/17  1644     History   Chief Complaint Chief Complaint  Patient presents with  . Migraine  . Palpitations    HPI Jenna Duran is a 31 y.o. female.  On/off again headaches for the last couple months is that of her blood pressure medication because she cannot afford it. Also states that she's had some abnormal bruising and a subconjunctival hemorrhage.   The history is provided by the patient and the spouse.  Migraine  This is a new problem. The problem occurs constantly. Associated symptoms include headaches. Pertinent negatives include no chest pain, no abdominal pain and no shortness of breath. Nothing aggravates the symptoms. Nothing relieves the symptoms. She has tried nothing for the symptoms. The treatment provided no relief.  Palpitations   Associated symptoms include headaches. Pertinent negatives include no chest pain, no abdominal pain and no shortness of breath.    Past Medical History:  Diagnosis Date  . Hypertension   . Renal disorder   . 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)     Patient Active Problem List   Diagnosis Date Noted  . 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 HTN (hypertension) 05/21/2013    History reviewed. No pertinent surgical history.  OB History    No data available       Home Medications    Prior to Admission medications   Medication Sig Start Date End Date Taking? Authorizing Provider  amLODipine (NORVASC) 10 MG tablet Take 1 tablet (10 mg total) by mouth daily. 06/26/16   Montine Circle, PA-C  carvedilol (COREG) 12.5 MG tablet Take 1 tablet (12.5 mg total) by mouth 2 (two) times daily with a meal. 05/21/13   Robbie Lis, MD  cloNIDine (CATAPRES) 0.2 MG tablet Take 1 tablet (0.2 mg total) by mouth 2 (two) times daily. 06/26/16   Montine Circle, PA-C  labetalol (NORMODYNE) 300 MG tablet Take 1 tablet (300 mg total) by mouth 2 (two) times  daily. 06/26/16   Montine Circle, PA-C  losartan (COZAAR) 100 MG tablet Take 1 tablet (100 mg total) by mouth daily. 07/01/17   Pharell Rolfson, Corene Cornea, MD  metoCLOPramide (REGLAN) 10 MG tablet Take 1 tablet (10 mg total) by mouth every 6 (six) hours as needed for nausea or vomiting. 07/01/17   Emmaus Brandi, Corene Cornea, MD    Family History History reviewed. No pertinent family history.  Social History Social History  Substance Use Topics  . Smoking status: Current Every Day Smoker    Types: Cigarettes  . Smokeless tobacco: Never Used  . Alcohol use No     Allergies   Patient has no known allergies.   Review of Systems Review of Systems  Respiratory: Negative for shortness of breath.   Cardiovascular: Positive for palpitations. Negative for chest pain.  Gastrointestinal: Negative for abdominal pain.  Neurological: Positive for headaches.  All other systems reviewed and are negative.    Physical Exam Updated Vital Signs BP (!) 228/138   Pulse 75   Temp 98.5 F (36.9 C) (Oral)   Resp 18   Ht 5\' 5"  (1.651 m)   Wt 67.1 kg (148 lb)   LMP 06/17/2017 (Approximate)   SpO2 97%   BMI 24.63 kg/m   Physical Exam  Constitutional: She is oriented to person, place, and time. She appears well-developed and well-nourished.  HENT:  Head: Normocephalic and atraumatic.  Eyes: Conjunctivae and EOM are normal.  Neck: Normal range of motion.  Cardiovascular: Normal rate and regular rhythm.   Pulmonary/Chest: Effort normal and breath sounds normal. No stridor. No respiratory distress.  Abdominal: Soft. Bowel sounds are normal. She exhibits no distension.  Neurological: She is alert and oriented to person, place, and time. No cranial nerve deficit. Coordination normal.  No altered mental status, able to give full seemingly accurate history.  Face is symmetric, EOM's intact, pupils equal and reactive, vision intact, tongue and uvula midline without deviation. Upper and Lower extremity motor 5/5, intact  pain perception in distal extremities, 2+ reflexes in biceps, patella and achilles tendons. Able to perform finger to nose normal with both hands. Walks without assistance or evident ataxia.   Skin: Skin is warm and dry.  Nursing note and vitals reviewed.    ED Treatments / Results  Labs (all labs ordered are listed, but only abnormal results are displayed) Labs Reviewed  BASIC METABOLIC PANEL - Abnormal; Notable for the following:       Result Value   Potassium 3.4 (*)    Glucose, Bld 148 (*)    BUN 30 (*)    Creatinine, Ser 2.80 (*)    Calcium 8.6 (*)    GFR calc non Af Amer 21 (*)    GFR calc Af Amer 25 (*)    All other components within normal limits  CBC - Abnormal; Notable for the following:    RBC 3.38 (*)    Hemoglobin 10.4 (*)    HCT 30.3 (*)    Platelets 142 (*)    All other components within normal limits  URINALYSIS, ROUTINE W REFLEX MICROSCOPIC - Abnormal; Notable for the following:    Glucose, UA 50 (*)    Hgb urine dipstick MODERATE (*)    Protein, ur >=300 (*)    Bacteria, UA RARE (*)    Squamous Epithelial / LPF 0-5 (*)    All other components within normal limits  LIPASE, BLOOD  I-STAT TROPONIN, ED    EKG  EKG Interpretation  Date/Time:  Monday July 01 2017 17:09:17 EDT Ventricular Rate:  88 PR Interval:  152 QRS Duration: 82 QT Interval:  404 QTC Calculation: 488 R Axis:   80 Text Interpretation:  Normal sinus rhythm Biatrial enlargement RSR' or QR pattern in V1 suggests right ventricular conduction delay similar to previous aside from different r wave progression Confirmed by Merrily Pew [phone]) on 07/01/2017 8:54:27 PM       Radiology Dg Chest 2 View  Result Date: 07/01/2017 CLINICAL DATA:  31 year old female with mid chest pain. EXAM: CHEST  2 VIEW COMPARISON:  Chest radiograph dated 04/18/2016 FINDINGS: The heart size and mediastinal contours are within normal limits. Both lungs are clear. The visualized skeletal structures are  unremarkable. IMPRESSION: No active cardiopulmonary disease. Electronically Signed   By: Anner Crete M.D.   On: 07/01/2017 18:18   Ct Head Wo Contrast  Result Date: 07/01/2017 CLINICAL DATA:  Headaches x1 month, hypertension EXAM: CT HEAD WITHOUT CONTRAST TECHNIQUE: Contiguous axial images were obtained from the base of the skull through the vertex without intravenous contrast. COMPARISON:  None. FINDINGS: Brain: No evidence of acute infarction, hemorrhage, hydrocephalus, extra-axial collection or mass lesion/mass effect. Vascular: No hyperdense vessel or unexpected calcification. Skull: Normal. Negative for fracture or focal lesion. Sinuses/Orbits: The visualized paranasal sinuses are essentially clear. The mastoid air cells are unopacified. Other: None. IMPRESSION: Normal head CT. Electronically Signed   By: Julian Hy M.D.   On: 07/01/2017 21:46    Procedures Procedures (  including critical care time)  Medications Ordered in ED Medications  magnesium sulfate IVPB 2 g 50 mL (2 g Intravenous New Bag/Given 07/02/17 0010)  losartan (COZAAR) tablet 50 mg (50 mg Oral Given 07/01/17 2256)  metoCLOPramide (REGLAN) injection 10 mg (10 mg Intravenous Given 07/01/17 2257)  diphenhydrAMINE (BENADRYL) injection 25 mg (25 mg Intravenous Given 07/01/17 2254)  sodium chloride 0.9 % bolus 1,000 mL (0 mLs Intravenous Stopped 07/02/17 0010)  dexamethasone (DECADRON) injection 10 mg (10 mg Intravenous Given 07/01/17 2258)  losartan (COZAAR) tablet 50 mg (50 mg Oral Given 07/02/17 0027)     Initial Impression / Assessment and Plan / ED Course  I have reviewed the triage vital signs and the nursing notes.  Pertinent labs & imaging results that were available during my care of the patient were reviewed by me and considered in my medical decision making (see chart for details).  Severely elevated blood pressures and worsening kidney function likely secondary to the same. Her headache and nausea is likely  related to the hypertension as well. I discussed admission to the hospital to get this under better control however she is self-pay and does not want to be admitted to the hospital for financial reasons. I reviewed the risks and benefits of that decision so the plan will be to try to start losartan which she's been on the past see if this helps her symptoms. I got case management involved to try to get her close follow-up which sounds like she she will get appointment tomorrow.  Final Clinical Impressions(s) / ED Diagnoses   Final diagnoses:  Hypertension, unspecified type    New Prescriptions New Prescriptions   LOSARTAN (COZAAR) 100 MG TABLET    Take 1 tablet (100 mg total) by mouth daily.   METOCLOPRAMIDE (REGLAN) 10 MG TABLET    Take 1 tablet (10 mg total) by mouth every 6 (six) hours as needed for nausea or vomiting.     Keimon Basaldua, Corene Cornea, MD 07/02/17 [phone]

## 2017-07-04 ENCOUNTER — Encounter: Payer: Self-pay | Admitting: Family Medicine

## 2017-07-04 ENCOUNTER — Ambulatory Visit (INDEPENDENT_AMBULATORY_CARE_PROVIDER_SITE_OTHER): Payer: Self-pay | Admitting: Family Medicine

## 2017-07-04 VITALS — BP 200/138 | HR 66 | Temp 98.0°F | Resp 16 | Ht 65.0 in | Wt 156.0 lb

## 2017-07-04 DIAGNOSIS — N289 Disorder of kidney and ureter, unspecified: Secondary | ICD-10-CM

## 2017-07-04 DIAGNOSIS — Z131 Encounter for screening for diabetes mellitus: Secondary | ICD-10-CM

## 2017-07-04 DIAGNOSIS — H579 Unspecified disorder of eye and adnexa: Secondary | ICD-10-CM

## 2017-07-04 DIAGNOSIS — N184 Chronic kidney disease, stage 4 (severe): Secondary | ICD-10-CM

## 2017-07-04 DIAGNOSIS — I160 Hypertensive urgency: Secondary | ICD-10-CM

## 2017-07-04 DIAGNOSIS — I517 Cardiomegaly: Secondary | ICD-10-CM

## 2017-07-04 DIAGNOSIS — R938 Abnormal findings on diagnostic imaging of other specified body structures: Secondary | ICD-10-CM

## 2017-07-04 LAB — COMPLETE METABOLIC PANEL WITH GFR
ALT: 34 U/L — AB (ref 6–29)
AST: 41 U/L — AB (ref 10–30)
Albumin: 3.6 g/dL (ref 3.6–5.1)
Alkaline Phosphatase: 53 U/L (ref 33–115)
BILIRUBIN TOTAL: 0.5 mg/dL (ref 0.2–1.2)
BUN: 36 mg/dL — AB (ref 7–25)
CHLORIDE: 101 mmol/L (ref 98–110)
CO2: 25 mmol/L (ref 20–31)
CREATININE: 3.09 mg/dL — AB (ref 0.50–1.10)
Calcium: 8.7 mg/dL (ref 8.6–10.2)
GFR, EST AFRICAN AMERICAN: 22 mL/min — AB (ref >=60)
GFR, Est Non African American: 19 mL/min — ABNORMAL LOW (ref >=60)
GLUCOSE: 81 mg/dL (ref 65–99)
Potassium: 4.3 mmol/L (ref 3.5–5.3)
SODIUM: 141 mmol/L (ref 135–146)
TOTAL PROTEIN: 5.5 g/dL — AB (ref 6.1–8.1)

## 2017-07-04 LAB — CBC WITH DIFFERENTIAL/PLATELET
BASOS PCT: 1 %
Basophils Absolute: 59 cells/uL (ref 0–200)
EOS ABS: 236 {cells}/uL (ref 15–500)
Eosinophils Relative: 4 %
HCT: 30.8 % — ABNORMAL LOW (ref 35.0–45.0)
Hemoglobin: 10.2 g/dL — ABNORMAL LOW (ref 11.7–15.5)
LYMPHS PCT: 23 %
Lymphs Abs: 1357 cells/uL (ref 850–3900)
MCH: 31.1 pg (ref 27.0–33.0)
MCHC: 33.1 g/dL (ref 32.0–36.0)
MCV: 93.9 fL (ref 80.0–100.0)
MONOS PCT: 5 %
MPV: 10.9 fL (ref 7.5–12.5)
Monocytes Absolute: 295 cells/uL (ref 200–950)
NEUTROS ABS: 3953 {cells}/uL (ref 1500–7800)
Neutrophils Relative %: 67 %
PLATELETS: 142 10*3/uL (ref 140–400)
RBC: 3.28 MIL/uL — AB (ref 3.80–5.10)
RDW: 15.4 % — AB (ref 11.0–15.0)
WBC: 5.9 10*3/uL (ref 3.8–10.8)

## 2017-07-04 LAB — POCT URINALYSIS DIP (DEVICE)
BILIRUBIN URINE: NEGATIVE
Glucose, UA: NEGATIVE mg/dL
KETONES UR: NEGATIVE mg/dL
Leukocytes, UA: NEGATIVE
NITRITE: NEGATIVE
PH: 7.5 (ref 5.0–8.0)
Specific Gravity, Urine: 1.02 (ref 1.005–1.030)
Urobilinogen, UA: 0.2 mg/dL (ref 0.0–1.0)

## 2017-07-04 MED ORDER — CLONIDINE HCL 0.1 MG PO TABS
0.2000 mg | ORAL_TABLET | Freq: Once | ORAL | Status: AC
Start: 2017-07-04 — End: 2017-07-04
  Administered 2017-07-04: 0.2 mg via ORAL

## 2017-07-04 MED ORDER — CLONIDINE HCL 0.1 MG/24HR TD PTWK: 0.1000 mg | MEDICATED_PATCH | TRANSDERMAL | 12 refills | Status: DC

## 2017-07-04 MED ORDER — ASPIRIN EC 81 MG PO TBEC: 81.0000 mg | DELAYED_RELEASE_TABLET | Freq: Every day | ORAL | 2 refills | Status: DC

## 2017-07-04 MED ORDER — AMLODIPINE BESYLATE 10 MG PO TABS: 10.0000 mg | ORAL_TABLET | Freq: Every day | ORAL | 3 refills | Status: DC

## 2017-07-04 MED ORDER — LABETALOL HCL 100 MG PO TABS
200.0000 mg | ORAL_TABLET | Freq: Two times a day (BID) | ORAL | 3 refills | Status: DC
Start: 2017-07-04 — End: 2017-07-19

## 2017-07-04 MED FILL — AMLODIPINE BESYLATE 10 MG T: 10 | 90 days supply | Qty: 90 | Fill #0

## 2017-07-04 MED FILL — LABETALOL HCL 100 MG TABLET: 100 | 15 days supply | Qty: 60 | Fill #0

## 2017-07-04 MED FILL — cloNIDine 0.1 MG/24HR PTWK: 0.1 | 28 days supply | Qty: 4 | Fill #0 | Status: TO

## 2017-07-04 NOTE — Progress Notes (Signed)
Patient ID: Jenna Duran, female    DOB: 21-Dec-1985, 31 y.o.   MRN: [id number]  PCP: Scot Jun, FNP  Chief Complaint  Patient presents with  . Hospitalization Follow-up    Subjective:  HPI Jenna Duran is a 31 y.o. female presents for a hospital follow-up.  Medical problems include: Uncontrolled Accelerated Hypertension, TTP, and Chronic Kidney Disease. Jenna Duran is a poor historian regarding her overall detailed medical history. Care Everywhere was reviewed briefly in order to gather additional medical history that the patient was unable to provide.Jenna Duran presented to [facility] Emergency Department with a complaint of headache and chest palpitations. She was found to be extremely hypertensive with blood pressure 228/138. She has been diagnosed with longstanding hypertension for several years. Discontinued blood pressure medications due to cost and being uninsured. Jenna Duran is uncertain of medication she was previously prescribed. According to Care Everywhere, Jenna Duran has received prior nephrology at [facility]. She last had a renal ultrasound (at [facility]) performed February 2017 which was significant for renal disease likely caused by uncontrolled hypertension. Patient reports at somepoint in the past receiving dialysis to correct renal function. She admits to daily marijuana use and reports that her doctor had told her in the past that this could help control her blood pressure. She also reports that she drinks baking soda and water and ultimately ounces per day to help with blood pressure. She states that after she smokes marijuana her blood pressure normalizes and the rest and the only thing helping control her blood pressure since her medication ran out. From prior EMR records, patient's prior antihypertension regimen consisted of amlodipine, clonidine, and labetalol. Again patient cannot confirm whether or not this regimen was effective in controlling her blood pressure.  She however denies any adverse effects while taking these medications. Jenna Duran reports continued daily headaches since leaving the hospital. She has associated visual disturbances in which she report noticing floaters and flashing lights at time. She also recently noticed a broken blood vessel in her right eye. Due to lack of financial resources, she has not had a recent eye exam. Social History   Social History  . Marital status: Single    Spouse name: N/A  . Number of children: N/A  . Years of education: N/A   Occupational History  . Not on file.   Social History Main Topics  . Smoking status: Current Every Day Smoker    Types: Cigarettes  . Smokeless tobacco: Never Used  . Alcohol use No  . Drug use: Yes    Types: Marijuana  . Sexual activity: Yes    Birth control/ protection: None   Other Topics Concern  . Not on file   Social History Narrative  . No narrative on file    History reviewed. No pertinent family history.  Review of Systems  Eyes: Positive for photophobia, redness and visual disturbance.  Respiratory: Negative.   Cardiovascular: Negative.   Neurological: Positive for dizziness, light-headedness and headaches. Negative for tremors, seizures, syncope, speech difficulty and weakness.    See HPI   Patient Active Problem List   Diagnosis Date Noted  . 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 HTN (hypertension) 05/21/2013    No Known Allergies  Prior to Admission medications   Medication Sig Start Date End Date Taking? Authorizing Provider  losartan (COZAAR) 100 MG tablet Take 1 tablet (100 mg total) by mouth daily. 07/01/17   Mesner, Corene Cornea, MD  metoCLOPramide (REGLAN) 10 MG tablet Take  1 tablet (10 mg total) by mouth every 6 (six) hours as needed for nausea or vomiting. 07/01/17   Mesner, Corene Cornea, MD    Past Medical, Surgical Family and Social History reviewed and updated.    Objective:   Today's Vitals   07/04/17 1316  BP: (!)  200/138  Pulse: 66  Resp: 16  Temp: 98 F (36.7 C)  TempSrc: Oral  SpO2: 99%  Weight: 156 lb (70.8 kg)  Height: 5\' 5"  (1.651 m)   Wt Readings from Last 3 Encounters:  07/04/17 156 lb (70.8 kg)  07/01/17 148 lb (67.1 kg)  06/26/16 145 lb (65.8 kg)   Physical Exam  Constitutional: She is oriented to person, place, and time. She appears well-developed and well-nourished.  HENT:  Head: Normocephalic and atraumatic.  Eyes: Pupils are equal, round, and reactive to light. Conjunctivae are normal.  Difficult to view vasculature of bilateral fundus.   right medial lateral of scleral hemorrhage of vessel noted on exam.   Neck: Normal range of motion. Neck supple. No thyromegaly present.  Cardiovascular: Regular rhythm and intact distal pulses.   Murmur heard. ICS 2-3 +2 murmur noted with auscultation.  Pulmonary/Chest: Effort normal and breath sounds normal.  Musculoskeletal: Normal range of motion.  Neurological: She is alert and oriented to person, place, and time.  Skin: Skin is warm and dry.  Psychiatric: She has a normal mood and affect. Her speech is normal and behavior is normal. Judgment and thought content normal. Cognition and memory are normal.   Assessment & Plan:  1. Hypertensive urgency: Significant hx of medication non compliance. Resuming patient back on last medication regimen. Will monitor in office closely for compliance and titrate medication doses as necessary to achieve good blood pressure control.  2. Screening for diabetes mellitus  3. Kidney function abnormal: According to prior EMR, patient has a hx of abusing NSAIDs in lieu of having the presence of diseased kidneys. She has required dialysis in the past (01/2016)  due to significant ARF. Most recent creatinine 3.09, BUN 36.  Urine microalbumin creatinine ratio: 1,162. Referring patient emergently to nephrology.  4. 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  5. CKD (chronic kidney disease) stage 4, GFR 15-29 ml/min [facility]),  Referring patient emergently to nephrology.   6. Biatrial enlargement Likely chronic related to underlying uncontrolled hypertension. Referring to Cardiology.  Orders placed this encounter: CBC with Differential - COMPLETE METABOLIC PANEL WITH GFR - cloNIDine (CATAPRES) tablet 0.2 mg; Take 2 tablets (0.2 mg total) by mouth once, x 2 doses in clinic  - Microalbumin/Creatinine Ratio, Urine - Hemoglobin A1c-4.4 - Ambulatory referral to Cardiology -Ambulatory referral to Ophthalmology - Ambulatory referral to Nephrology  . cloNIDine (CATAPRES) tablet 0.2 mg  . aspirin EC 81 MG tablet    Sig: Take 1 tablet (81 mg total) by mouth daily.    Dispense:  90 tablet    Refill:  2    Order Specific Question:   Supervising Provider    Answer:   Tresa Garter [id number]  . labetalol (NORMODYNE) 100 MG tablet    Sig: Take 2 tablets (200 mg total) by mouth 2 (two) times daily.    Dispense:  60 tablet    Refill:  3    Order Specific Question:   Supervising Provider    Answer:   Tresa Garter [id number]  . amLODipine (NORVASC) 10 MG tablet    Sig: Take 1 tablet (10 mg total) by  mouth daily.    Dispense:  90 tablet    Refill:  3    Order Specific Question:   Supervising Provider    Answer:   Tresa Garter [id number]  . cloNIDine (CATAPRES - DOSED IN MG/24 HR) 0.1 mg/24hr patch    Sig: Place 1 patch (0.1 mg total) onto the skin once a week.    Dispense:  4 patch    Refill:  12    Order Specific Question:   Supervising Provider    Answer:   Tresa Garter [[id number]]    RTC: 1 week for blood pressure recheck and 4 weeks for hypertension follow-up.  [name]. Kenton Kingfisher, MSN, FNP-C [facility]  [address] [place], [place] [address] [phone]

## 2017-07-04 NOTE — Patient Instructions (Signed)
I am referring you to nephrology for further evaluation of kidney function and cardiology for further evaluation of overall heart function.  For evaluation of retinopathy related to chronic uncontrolled hypertension, I am referring you to opthalmology.  You will be contacted to schedule these appointments.  Please pick-up the following medications from [place] and take as directed.    Chronic Kidney Disease, Adult Chronic kidney disease (CKD) happens when the kidneys are damaged during a time of 3 or more months. The kidneys are two organs that do many important jobs in the body. These jobs include:  Removing wastes and extra fluids from the blood.  Making hormones that maintain the amount of fluid in your tissues and blood vessels.  Making sure that the body has the right amount of fluids and chemicals.  Most of the time, this condition does not go away, but it can usually be controlled. Steps must be taken to slow down the kidney damage or stop it from getting worse. Otherwise, the kidneys may stop working. Follow these instructions at home:  Follow your diet as told by your doctor. You may need to avoid alcohol, salty foods (sodium), and foods that are high in potassium, calcium, and protein.  Take over-the-counter and prescription medicines only as told by your doctor. Do not take any new medicines unless your doctor says you can do that. These include vitamins and minerals. ? Medicines and nutritional supplements can make kidney damage worse. ? Your doctor may need to change how much medicine you take.  Do not use any tobacco products. These include cigarettes, chewing tobacco, and e-cigarettes. If you need help quitting, ask your doctor.  Keep all follow-up visits as told by your doctor. This is important.  Check your blood pressure. Tell your doctor if there are changes to your blood pressure.  Get to a healthy weight. Stay at that weight. If you need  help with this, ask your doctor.  Start or continue an exercise plan. Try to exercise at least 30 minutes a day, 5 days a week.  Stay up-to-date with your shots (immunizations) as told by your doctor. Contact a doctor if:  Your symptoms get worse.  You have new symptoms. Get help right away if:  You have symptoms of end-stage kidney disease. These include: ? Headaches. ? Skin that is darker or lighter than normal. ? Numbness in your hands or feet. ? Easy bruising. ? Having hiccups often. ? Chest pain. ? Shortness of breath. ? Stopping of menstrual periods in women.  You have a fever.  You are making very little pee (urine).  You have pain or bleeding when you pee (urinate). This information is not intended to replace advice given to you by your health care provider. Make sure you discuss any questions you have with your health care provider. Document Released: 02/13/2010 Document Revised: 04/26/2016 Document Reviewed: 07/18/2012 Elsevier Interactive Patient Education  2017 [employer].   Cannabis Use Disorder Cannabis use disorder is when using marijuana disrupts a person's daily life or causes health problems. This condition can be dangerous. The health problems this condition can cause include:  Long-lasting problems with thinking and learning. These can be permanent in young people.  Severe anxiety.  Paranoia.  Hallucinations.  Dangerously high blood pressure and heart rate.  Schizophrenia.  Breathing problems.  Problems with child development during and after pregnancy.  People with this condition are also more likely to use other drugs. What are the causes? This  condition is caused by using marijuana too much over time. It is not caused by using it only once in a while. Many people with this condition use marijuana because it gives them a feeling of extreme pleasure or relaxation. What increases the risk? This condition is more likely to develop  in:  Men.  People with a family history of cannabis use disorder.  People with mental health issues such as depression or post-traumatic stress disorder.  What are the signs or symptoms? Symptoms of this condition include:  Using greater amounts of marijuana than you want to, or using marijuana for longer than you want to.  Craving marijuana.  Spending a lot of time getting marijuana and using it or recovering from its effects.  Having problems at work, at school, at home, or in relationships because of marijuana use.  Giving up or cutting down on important life activities because of marijuana use.  Using marijuana at times when it is dangerous, such as while you are driving a car.  Needing more and more marijuana to get the same effect you want from the marijuana (building up a tolerance).  Physical problems, such as: ? A long-lasting cough. ? Bronchitis. ? Emphysema. ? Throat and lung cancer.  Mental problems, such as: ? Psychosis. ? Anxiety. ? Trouble sleeping.  Having symptoms of withdrawal when you stop using marijuana. Symptom of withdrawal include: ? Irritability or anger. ? Anxiety or restlessness. ? Trouble sleeping. ? Loss of appetite or weight loss. ? Aches and pains. ? Shakiness, sweating, or chills.  How is this diagnosed? This condition is diagnosed with an assessment. During the assessment, your health care provider will ask about your marijuana use and about how it affects your life. You will be diagnosed with the condition if you have had at least two symptoms of this condition within a 22-month period. How severe the condition is depends on how many symptoms you have.  If you have two to three symptoms, your condition is mild.  If you have four to five symptoms, your condition is moderate.  If you have six or more symptoms, your condition is severe.  Your health care provider may perform a physical exam or do lab tests to see if you have physical  problems resulting from marijuana use. Your health care provider may also screen for drug use and refer you to a mental health professional for evaluation. How is this treated? Treatment for this condition is usually provided by mental health professionals with training in substance use disorders. Your treatment may involve:  Counseling. This treatment is also called talk therapy. It is provided by substance use treatment counselors. A counselor can address the reasons you use marijuana and suggest ways to keep you from using it again. The goals of talk therapy are to: ? Find healthy activities to replace using marijuana. ? Identify and avoid the things that trigger your marijuana use. ? Help you learn how to handle cravings.  Support groups. Support groups are led by people who have quit using marijuana. They provide emotional support, advice, and guidance.  Medicine. Medicine is used to treat mental health issues that trigger marijuana use or that result from it.  Follow these instructions at home:  Take over-the-counter and prescription medicines only as told by your health care provider.  Check with your health care provider before starting any new medicines.  Keep all follow-up visits as told by your health care provider. This is important.  Work with your  counselor or group to develop tools to keep you from using marijuana again (relapsing).  Make healthy lifestyle choices, such as: ? Eating a healthy diet. ? Getting enough exercise. ? Improving your stress-management skills.  Learn daily living skills and work Psychologist, occupational. Where to find more information:  [employer] on Drug Abuse: motorcyclefax.com  Substance Abuse and Mental Health Services Administration: ktimeonline.com Contact a health care provider if:  You are not able to take your medicines as told.  Your symptoms get worse. Get help right away if:  You have serious thoughts about hurting yourself or  others. If you ever feel like you may hurt yourself or others, or have thoughts about taking your own life, get help right away. You can go to your nearest emergency department or call:  Your local emergency services (911 in the [place]  A suicide crisis helpline, such as the [place] at 951-496-1463. This is open 24 hours a day.  This information is not intended to replace advice given to you by your health care provider. Make sure you discuss any questions you have with your health care provider. Document Released: 11/16/2000 Document Revised: 08/17/2016 Document Reviewed: 08/17/2016 Elsevier Interactive Patient Education  2018 [employer].   Hypertension Hypertension is another name for high blood pressure. High blood pressure forces your heart to work harder to pump blood. This can cause problems over time. There are two numbers in a blood pressure reading. There is a top number (systolic) over a bottom number (diastolic). It is best to have a blood pressure below 120/80. Healthy choices can help lower your blood pressure. You may need medicine to help lower your blood pressure if:  Your blood pressure cannot be lowered with healthy choices.  Your blood pressure is higher than 130/80.  Follow these instructions at home: Eating and drinking  If directed, follow the DASH eating plan. This diet includes: ? Filling half of your plate at each meal with fruits and vegetables. ? Filling one quarter of your plate at each meal with whole grains. Whole grains include whole wheat pasta, brown rice, and whole grain bread. ? Eating or drinking low-fat dairy products, such as skim milk or low-fat yogurt. ? Filling one quarter of your plate at each meal with low-fat (lean) proteins. Low-fat proteins include fish, skinless chicken, eggs, beans, and tofu. ? Avoiding fatty meat, cured and processed meat, or chicken with skin. ? Avoiding premade or processed food.  Eat  less than 1,500 mg of salt (sodium) a day.  Limit alcohol use to no more than 1 drink a day for nonpregnant women and 2 drinks a day for men. One drink equals 12 oz of beer, 5 oz of wine, or 1 oz of hard liquor. Lifestyle  Work with your doctor to stay at a healthy weight or to lose weight. Ask your doctor what the best weight is for you.  Get at least 30 minutes of exercise that causes your heart to beat faster (aerobic exercise) most days of the week. This may include walking, swimming, or biking.  Get at least 30 minutes of exercise that strengthens your muscles (resistance exercise) at least 3 days a week. This may include lifting weights or pilates.  Do not use any products that contain nicotine or tobacco. This includes cigarettes and e-cigarettes. If you need help quitting, ask your doctor.  Check your blood pressure at home as told by your doctor.  Keep all follow-up visits as told by your  doctor. This is important. Medicines  Take over-the-counter and prescription medicines only as told by your doctor. Follow directions carefully.  Do not skip doses of blood pressure medicine. The medicine does not work as well if you skip doses. Skipping doses also puts you at risk for problems.  Ask your doctor about side effects or reactions to medicines that you should watch for. Contact a doctor if:  You think you are having a reaction to the medicine you are taking.  You have headaches that keep coming back (recurring).  You feel dizzy.  You have swelling in your ankles.  You have trouble with your vision. Get help right away if:  You get a very bad headache.  You start to feel confused.  You feel weak or numb.  You feel faint.  You get very bad pain in your: ? Chest. ? Belly (abdomen).  You throw up (vomit) more than once.  You have trouble breathing. Summary  Hypertension is another name for high blood pressure.  Making healthy choices can help lower blood  pressure. If your blood pressure cannot be controlled with healthy choices, you may need to take medicine. This information is not intended to replace advice given to you by your health care provider. Make sure you discuss any questions you have with your health care provider. Document Released: 05/07/2008 Document Revised: 10/17/2016 Document Reviewed: 10/17/2016 Elsevier Interactive Patient Education  [employer].

## 2017-07-05 LAB — MICROALBUMIN / CREATININE URINE RATIO
Creatinine, Urine: 58 mg/dL (ref 20–320)
MICROALB/CREAT RATIO: 1162 ug/mg{creat} — AB (ref <30)
Microalb, Ur: 67.4 mg/dL

## 2017-07-05 LAB — HEMOGLOBIN A1C
HEMOGLOBIN A1C: 4.4 % (ref <5.7)
MEAN PLASMA GLUCOSE: 80 mg/dL

## 2017-07-08 ENCOUNTER — Encounter: Payer: Self-pay | Admitting: Family Medicine

## 2017-07-08 ENCOUNTER — Telehealth: Payer: Self-pay | Admitting: Family Medicine

## 2017-07-08 NOTE — Telephone Encounter (Signed)
Chart is complete. Please fax referrals to opthamology, nephrology (this should be urgent, patients GFR 19) call [facility] to attempt to have patient worked in. [place] application is pending.  Thanks!   [name]. Kenton Kingfisher, MSN, FNP-C [facility]  [address] C[address] [place], [place] [address] [phone]

## 2017-07-09 NOTE — Telephone Encounter (Signed)
Referrals faxed. 

## 2017-07-15 ENCOUNTER — Ambulatory Visit (INDEPENDENT_AMBULATORY_CARE_PROVIDER_SITE_OTHER): Payer: Self-pay | Admitting: Family Medicine

## 2017-07-15 VITALS — BP 196/153 | HR 100 | Temp 98.7°F | Resp 16 | Ht 65.0 in

## 2017-07-15 DIAGNOSIS — Z719 Counseling, unspecified: Secondary | ICD-10-CM

## 2017-07-15 MED ORDER — CLONIDINE HCL 0.1 MG PO TABS
0.2000 mg | ORAL_TABLET | Freq: Once | ORAL | Status: AC
  Administered 2017-07-15: 0.2 mg via ORAL

## 2017-07-15 NOTE — Progress Notes (Signed)
20

## 2017-07-15 NOTE — Progress Notes (Signed)
Patient notified

## 2017-07-15 NOTE — Progress Notes (Signed)
Jenna Duran presents today for a nurse's visit for BP Check: Patient is being referred to the ED for further evaluation. Patient has not started her complete blood pressure regimen as she reports that the pharmacy is closed before she goes to work and after she leaves. She doesn't desire to go to the emergency department as she feels that they do nothing for her and her blood pressure has been elevated in the 200 for over one year. She has been treated in office with 0.2 Clonidine. She is nauseated with one episode here in office of vomiting.  Blood pressure rechecked: 196/153. Patient was advised to go to the ED once again and follow-up here in office on Friday 07/19/2017.  [name]. Kenton Kingfisher, MSN, FNP-C [facility] [place]  [name], [place] [address] [phone]

## 2017-07-19 ENCOUNTER — Ambulatory Visit (INDEPENDENT_AMBULATORY_CARE_PROVIDER_SITE_OTHER): Payer: Self-pay | Admitting: Family Medicine

## 2017-07-19 VITALS — BP 160/100

## 2017-07-19 DIAGNOSIS — Z0130 Encounter for examination of blood pressure without abnormal findings: Secondary | ICD-10-CM

## 2017-07-19 MED ORDER — CLONIDINE HCL 0.1 MG PO TABS
0.1000 mg | ORAL_TABLET | Freq: Once | ORAL | Status: AC
  Administered 2017-07-19: 0.1 mg via ORAL

## 2017-07-19 MED ORDER — LABETALOL HCL 100 MG PO TABS: 200.0000 mg | ORAL_TABLET | Freq: Three times a day (TID) | ORAL | 3 refills | Status: DC

## 2017-07-19 NOTE — Patient Instructions (Signed)
Increase Labetalol 200 mg, 3 times daily. Return in 2 weeks for hypertension follow-up.

## 2017-07-19 NOTE — Progress Notes (Signed)
Increase labetalol 200 mg 3 times daily.

## 2017-07-19 NOTE — Addendum Note (Signed)
Addended by: Scot Jun on: 07/19/2017 03:13 PM   Modules accepted: Orders

## 2017-08-02 ENCOUNTER — Ambulatory Visit: Payer: Self-pay | Admitting: Family Medicine

## 2018-05-02 MED FILL — LABETALOL HCL 300 MG TABLET: 300 | 30 days supply | Qty: 120 | Fill #0

## 2018-05-02 MED FILL — NIFEDIPINE ER 90 MG TABLET: 90 | 30 days supply | Qty: 30 | Fill #0

## 2018-05-02 MED FILL — LOSARTAN POTASSIUM 50 MG TA: 50 | 30 days supply | Qty: 30 | Fill #0

## 2018-06-04 MED FILL — LOSARTAN POTASSIUM 50 MG TA: 50 | 30 days supply | Qty: 30 | Fill #1

## 2018-06-04 MED FILL — NIFEDIPINE ER 90 MG TABLET: 90 | 30 days supply | Qty: 30 | Fill #1

## 2018-07-11 MED FILL — LOSARTAN POTASSIUM 50 MG TA: 50 | 30 days supply | Qty: 30 | Fill #2

## 2018-07-11 MED FILL — NIFEDIPINE ER 90 MG TABLET: 90 | 30 days supply | Qty: 30 | Fill #2

## 2018-08-12 MED FILL — LOSARTAN POTASSIUM 50 MG TA: 50 | 30 days supply | Qty: 30 | Fill #3

## 2018-08-12 MED FILL — NIFEdipine ER OSMOTIC RELEA: 90 | 25 days supply | Qty: 25 | Fill #3

## 2018-09-15 MED FILL — LOSARTAN POTASSIUM 50 MG TA: 50 | 30 days supply | Qty: 30 | Fill #4

## 2018-09-16 ENCOUNTER — Other Ambulatory Visit: Payer: Self-pay

## 2018-09-16 ENCOUNTER — Encounter: Payer: Self-pay | Admitting: Critical Care Medicine

## 2018-09-16 ENCOUNTER — Ambulatory Visit: Payer: Self-pay | Attending: Critical Care Medicine | Admitting: Critical Care Medicine

## 2018-09-16 VITALS — BP 165/111 | HR 70 | Temp 98.6°F | Resp 20 | Ht 65.0 in | Wt 140.0 lb

## 2018-09-16 DIAGNOSIS — N184 Chronic kidney disease, stage 4 (severe): Secondary | ICD-10-CM

## 2018-09-16 DIAGNOSIS — I10 Essential (primary) hypertension: Secondary | ICD-10-CM

## 2018-09-16 DIAGNOSIS — D5932 Hereditary hemolytic-uremic syndrome: Secondary | ICD-10-CM

## 2018-09-16 DIAGNOSIS — D593 Hemolytic-uremic syndrome: Secondary | ICD-10-CM

## 2018-09-16 MED ORDER — LOSARTAN POTASSIUM 50 MG PO TABS: 50.0000 mg | ORAL_TABLET | Freq: Every day | ORAL | 6 refills | Status: DC

## 2018-09-16 MED ORDER — AMLODIPINE BESYLATE 10 MG PO TABS: 10.0000 mg | ORAL_TABLET | Freq: Every day | ORAL | 6 refills | Status: DC

## 2018-09-16 MED ORDER — NIFEDIPINE ER 90 MG PO TB24: 90.0000 mg | ORAL_TABLET | Freq: Every day | ORAL | 6 refills | Status: DC

## 2018-09-16 NOTE — Progress Notes (Addendum)
Subjective:    Patient ID: Jenna Duran, female    DOB: 03/12/1986, 32 y.o.   MRN: [id number]  32 y.o.F here for evaluation of HTN, previous hx of autosomal dominant hemolytic uremic syndrome,  Not seen since 2018 in our System. 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   She did have acute renal failure and required hemodialysis with previous admissions of the flareup of her hemolytic syndrome.  The patient also notes that hypertension is often a flare up of the hemolytic syndrome.  The patient's care has been in [place] for the past several years and we do not yet have records from those visits.  Patient is here for refills today on her medications which currently consist of losartan and nifedipine.  She previously had been on Catapres and amlodipine with poor results.  She has been advised to avoid nonsteroidal anti-inflammatories.  She also avoids flu shots because of a trigger of her hemolytic syndrome.  The patient is here today to reestablish for primary care and evaluation of her hypertension.  Hypertension  This is a chronic problem. The current episode started more than 1 year ago. The problem is unchanged. The problem is uncontrolled. Associated symptoms include headaches, malaise/fatigue, palpitations and peripheral edema. Pertinent negatives include no blurred vision, chest pain, orthopnea, PND, shortness of breath or sweats. (Palpitations ) There are no known risk factors for coronary artery disease. Past treatments include angiotensin blockers and calcium channel blockers. Compliance problems: ran out , in transition.  Hypertensive end-organ damage includes kidney disease. There is no history of PVD. Identifiable causes of hypertension include chronic renal  disease. There is no history of sleep apnea or a thyroid problem.      Past Medical History:  Diagnosis Date  . 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 Hypertension   . Renal disorder          Family History  Family history unknown: Yes     Social History   Socioeconomic History  . Marital status: Single    Spouse name: Not on file  . Number of children: Not on file  . Years of education: Not on file  . Highest education level: Not on file  Occupational History  . Not on file  Social Needs  . Financial resource strain: Not on file  . Food insecurity:    Worry: Not on file    Inability: Not on file  . Transportation needs:    Medical: Not on file    Non-medical: Not on file  Tobacco Use  . Smoking status: Former Smoker    Types: Cigarettes  . Smokeless tobacco: Never Used  Substance and Sexual Activity  . Alcohol use: No  . Drug use: Yes    Types: Marijuana  . Sexual activity: Yes    Birth control/protection: None  Lifestyle  . Physical activity:    Days per week: Not on file    Minutes per session: Not on file  . Stress: Not on file  Relationships  . Social connections:    Talks on phone: Not on file    Gets together: Not on file    Attends religious service: Not on file    Active member of  club or organization: Not on file    Attends meetings of clubs or organizations: Not on file    Relationship status: Not on file  . Intimate partner violence:    Fear of current or ex partner: Not on file    Emotionally abused: Not on file    Physically abused: Not on file    Forced sexual activity: Not on file  Other Topics Concern  . Not on file  Social History Narrative  . Not on file     No Known Allergies   Outpatient Medications Prior to Visit  Medication Sig Dispense Refill  . amLODipine (NORVASC) 10 MG tablet Take 1 tablet (10 mg total) by mouth daily. 90 tablet 3  . losartan  (COZAAR) 50 MG tablet Take 50 mg by mouth daily.    Marland Kitchen NIFEdipine (ADALAT CC) 90 MG 24 hr tablet Take 90 mg by mouth daily.    Marland Kitchen aspirin EC 81 MG tablet Take 1 tablet (81 mg total) by mouth daily. (Patient not taking: Reported on 09/16/2018) 90 tablet 2  . cloNIDine (CATAPRES - DOSED IN MG/24 HR) 0.1 mg/24hr patch Place 1 patch (0.1 mg total) onto the skin once a week. (Patient not taking: Reported on 09/16/2018) 4 patch 12  . labetalol (NORMODYNE) 100 MG tablet Take 2 tablets (200 mg total) by mouth 3 (three) times daily. (Patient not taking: Reported on 09/16/2018) 60 tablet 3  . metoCLOPramide (REGLAN) 10 MG tablet Take 1 tablet (10 mg total) by mouth every 6 (six) hours as needed for nausea or vomiting. (Patient not taking: Reported on 09/16/2018) 30 tablet 0   No facility-administered medications prior to visit.      Review of Systems  Constitutional: Positive for malaise/fatigue.  Eyes: Negative for blurred vision.  Respiratory: Negative for shortness of breath.   Cardiovascular: Positive for palpitations. Negative for chest pain, orthopnea and PND.  Neurological: Positive for headaches.       Objective:   Physical Exam  Vitals:   09/16/18 1526  BP: (!) 165/111  Pulse: 70  Resp: 20  Temp: 98.6 F (37 C)  TempSrc: Oral  SpO2: 99%  Weight: 140 lb (63.5 kg)  Height: 5\' 5"  (1.651 m)    Gen: Pleasant, well-nourished, in no distress,  normal affect  ENT: No lesions,  mouth clear,  oropharynx clear, no postnasal drip  Neck: No JVD, no TMG, no carotid bruits  Lungs: No use of accessory muscles, no dullness to percussion, clear without rales or rhonchi  Cardiovascular: RRR, heart sounds normal, no murmur or gallops, no peripheral edema  Abdomen: soft and NT, no HSM,  BS normal  Musculoskeletal: No deformities, no cyanosis or clubbing  Neuro: alert, non focal  Skin: Warm, no lesions or rashes  No results found.  CMP Latest Ref Rng & Units 09/16/2018 07/04/2017  07/01/2017  Glucose 65 - 99 mg/dL 79 81 148(H)  BUN 6 - 20 mg/dL 58(H) 36(H) 30(H)  Creatinine 0.57 - 1.00 mg/dL 3.49(H) 3.09(H) 2.80(H)  Sodium 134 - 144 mmol/L 140 141 139  Potassium 3.5 - 5.2 mmol/L 5.0 4.3 3.4(L)  Chloride 96 - 106 mmol/L 105 101 103  CO2 20 - 29 mmol/L 18(L) 25 26  Calcium 8.7 - 10.2 mg/dL 8.3(L) 8.7 8.6(L)  Total Protein 6.0 - 8.5 g/dL 6.0 5.5(L) -  Total Bilirubin 0.0 - 1.2 mg/dL <0.2 0.5 -  Alkaline Phos 39 - 117 IU/L 73 53 -  AST 0 - 40 IU/L 22 41(H) -  ALT 0 - 32 IU/L 17 34(H) -   Lab Results  Component Value Date   WBC 4.3 09/16/2018   HGB 9.5 (L) 09/16/2018   HCT 28.3 (L) 09/16/2018   MCV 93 09/16/2018   PLT 275 09/16/2018   CXR 2018: NAD     Assessment & Plan:  I personally reviewed all images and lab data in the [facility] system as well as any outside material available during this office visit and agree with the  radiology impressions.   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  We do not have any outside records on this and will need to obtaIn these records.  The patient appears to be stable at this time. Plan We will need to obtain outside records on this patient's hemolytic uremic syndrome The patient will likely need nephrology referral We will obtain baseline labs to include a complete metabolic profile along with a CBC with differential and iron studies  CKD (chronic kidney disease) stage 4, GFR 15-29 ml/min (HCC) The last recorded GFR was between 15 and 30 mils per minute putting this patient has CKD stage IV with a creatinine over 3 and 2018 Plan Will obtain a complete metabolic panel today to see with the current GFR is for this patient The patient will need nephrology follow-up at some point  HTN (hypertension) Hypertension is poorly  controlled but in part due to the patient recently running out of medications Plan The patient's losartan will be refilled at 50 mg daily and the nifedipine long-acting will be refilled at 90 mg daily  Will follow up with the complete metabolic panel The patient is to return in 1 week for a nursing blood pressure check   Diagnoses and all orders for this visit:  Essential hypertension  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 -     losartan (COZAAR) 50 MG tablet; Take 1 tablet (50 mg total) by mouth daily. -     NIFEdipine (ADALAT CC) 90 MG 24 hr tablet; Take 1 tablet (90 mg total) by mouth daily.

## 2018-09-16 NOTE — Assessment & Plan Note (Signed)
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  We do not have any outside records on this and will need to obtaIn these records.  The patient appears to be stable at this time. Plan We will need to obtain outside records on this patient's hemolytic uremic syndrome The patient will likely need nephrology referral We will obtain baseline labs to include a complete metabolic profile along with a CBC with differential and iron studies

## 2018-09-16 NOTE — Progress Notes (Signed)
Needs refill for BP  Out of medication x 3 days Headache due to being out of medication x 3 days

## 2018-09-16 NOTE — Assessment & Plan Note (Signed)
Hypertension is poorly controlled but in part due to the patient recently running out of medications Plan The patient's losartan will be refilled at 50 mg daily and the nifedipine long-acting will be refilled at 90 mg daily  Will follow up with the complete metabolic panel The patient is to return in 1 week for a nursing blood pressure check

## 2018-09-16 NOTE — Patient Instructions (Addendum)
Refill your Losartan today Stay on Nifedipine 90mg  daily Labs today : CMET and CBC and Iron level Sign form to obtain outside records from [facility] Return one week for Nurse blood pressure check We will establish a primary care visit

## 2018-09-16 NOTE — Assessment & Plan Note (Signed)
The last recorded GFR was between 15 and 30 mils per minute putting this patient has CKD stage IV with a creatinine over 3 and 2018 Plan Will obtain a complete metabolic panel today to see with the current GFR is for this patient The patient will need nephrology follow-up at some point

## 2018-09-17 LAB — CBC WITH DIFFERENTIAL/PLATELET
BASOS ABS: 0 10*3/uL (ref 0.0–0.2)
Basos: 1 %
EOS (ABSOLUTE): 0.2 10*3/uL (ref 0.0–0.4)
EOS: 4 %
HEMATOCRIT: 28.3 % — AB (ref 34.0–46.6)
HEMOGLOBIN: 9.5 g/dL — AB (ref 11.1–15.9)
Immature Grans (Abs): 0 10*3/uL (ref 0.0–0.1)
Immature Granulocytes: 0 %
LYMPHS ABS: 1.2 10*3/uL (ref 0.7–3.1)
Lymphs: 28 %
MCH: 31.1 pg (ref 26.6–33.0)
MCHC: 33.6 g/dL (ref 31.5–35.7)
MCV: 93 fL (ref 79–97)
MONOCYTES: 8 %
Monocytes Absolute: 0.3 10*3/uL (ref 0.1–0.9)
NEUTROS ABS: 2.6 10*3/uL (ref 1.4–7.0)
Neutrophils: 59 %
Platelets: 275 10*3/uL (ref 150–450)
RBC: 3.05 x10E6/uL — ABNORMAL LOW (ref 3.77–5.28)
RDW: 13.3 % (ref 12.3–15.4)
WBC: 4.3 10*3/uL (ref 3.4–10.8)

## 2018-09-17 LAB — COMPREHENSIVE METABOLIC PANEL
A/G RATIO: 2.5 — AB (ref 1.2–2.2)
ALK PHOS: 73 IU/L (ref 39–117)
ALT: 17 IU/L (ref 0–32)
AST: 22 IU/L (ref 0–40)
Albumin: 4.3 g/dL (ref 3.5–5.5)
BUN/Creatinine Ratio: 17 (ref 9–23)
BUN: 58 mg/dL — ABNORMAL HIGH (ref 6–20)
Bilirubin Total: <0.2 mg/dL (ref 0.0–1.2)
CALCIUM: 8.3 mg/dL — AB (ref 8.7–10.2)
CHLORIDE: 105 mmol/L (ref 96–106)
CO2: 18 mmol/L — ABNORMAL LOW (ref 20–29)
Creatinine, Ser: 3.49 mg/dL — ABNORMAL HIGH (ref 0.57–1.00)
GFR calc Af Amer: 19 mL/min/{1.73_m2} — ABNORMAL LOW (ref >59)
GFR calc non Af Amer: 16 mL/min/{1.73_m2} — ABNORMAL LOW (ref >59)
GLOBULIN, TOTAL: 1.7 g/dL (ref 1.5–4.5)
Glucose: 79 mg/dL (ref 65–99)
POTASSIUM: 5 mmol/L (ref 3.5–5.2)
Sodium: 140 mmol/L (ref 134–144)
Total Protein: 6 g/dL (ref 6.0–8.5)

## 2018-09-17 LAB — IRON: IRON: 71 ug/dL (ref 27–159)

## 2018-09-22 ENCOUNTER — Telehealth: Payer: Self-pay

## 2018-09-22 NOTE — Telephone Encounter (Signed)
Pt called to f/u with Triage for a nurse BP check.  Msg left on pt VM to call into [facility] Triage for a msg from Dr Joya Gaskins.

## 2018-10-02 NOTE — Telephone Encounter (Signed)
Follow up call placed to patient. She states her blood pressure reading when taken a couple of weeks ago was 120/78. Enocuraged patient to follow up for nurse visit to have BP checked. Patient stated she preferred to call back for appointment.

## 2018-10-17 ENCOUNTER — Ambulatory Visit: Payer: Self-pay | Admitting: Family Medicine

## 2018-10-17 MED FILL — LOSARTAN POTASSIUM 50 MG TA: 50 | 30 days supply | Qty: 30 | Fill #0

## 2018-10-17 MED FILL — NIFEdipine ER OSMOTIC RELEA: 90 | 30 days supply | Qty: 30 | Fill #0

## 2018-10-20 ENCOUNTER — Ambulatory Visit: Payer: Self-pay | Admitting: Family Medicine

## 2018-11-13 MED FILL — LOSARTAN POTASSIUM 50 MG TA: 50 | 30 days supply | Qty: 30 | Fill #1

## 2018-12-16 MED FILL — LOSARTAN POTASSIUM 50 MG TA: 50 | 30 days supply | Qty: 30 | Fill #2

## 2019-01-16 MED FILL — LOSARTAN POTASSIUM 50 MG TA: 50 | 30 days supply | Qty: 30 | Fill #3

## 2019-02-17 MED FILL — LOSARTAN POTASSIUM 50 MG TA: 50 | 30 days supply | Qty: 30 | Fill #4

## 2019-03-02 ENCOUNTER — Emergency Department (HOSPITAL_COMMUNITY)
Admission: EM | Admit: 2019-03-02 | Discharge: 2019-03-02 | Disposition: A | Discharge status: Home / Self Care | Payer: Medicaid Other | Attending: Emergency Medicine | Admitting: Emergency Medicine

## 2019-03-02 ENCOUNTER — Other Ambulatory Visit: Payer: Self-pay

## 2019-03-02 ENCOUNTER — Emergency Department (HOSPITAL_COMMUNITY): Payer: Medicaid Other

## 2019-03-02 ENCOUNTER — Encounter (HOSPITAL_COMMUNITY): Payer: Self-pay | Admitting: *Deleted

## 2019-03-02 ENCOUNTER — Other Ambulatory Visit: Payer: Self-pay | Admitting: Family

## 2019-03-02 DIAGNOSIS — H53133 Sudden visual loss, bilateral: Secondary | ICD-10-CM | POA: Diagnosis not present

## 2019-03-02 DIAGNOSIS — D5939 Other hemolytic-uremic syndrome: Secondary | ICD-10-CM

## 2019-03-02 DIAGNOSIS — G9389 Other specified disorders of brain: Secondary | ICD-10-CM

## 2019-03-02 DIAGNOSIS — I10 Essential (primary) hypertension: Secondary | ICD-10-CM | POA: Diagnosis not present

## 2019-03-02 DIAGNOSIS — H539 Unspecified visual disturbance: Secondary | ICD-10-CM | POA: Diagnosis present

## 2019-03-02 DIAGNOSIS — I160 Hypertensive urgency: Secondary | ICD-10-CM

## 2019-03-02 DIAGNOSIS — D593 Hemolytic-uremic syndrome: Secondary | ICD-10-CM

## 2019-03-02 DIAGNOSIS — R220 Localized swelling, mass and lump, head: Secondary | ICD-10-CM

## 2019-03-02 DIAGNOSIS — H547 Unspecified visual loss: Secondary | ICD-10-CM

## 2019-03-02 DIAGNOSIS — D500 Iron deficiency anemia secondary to blood loss (chronic): Secondary | ICD-10-CM

## 2019-03-02 DIAGNOSIS — N189 Chronic kidney disease, unspecified: Secondary | ICD-10-CM

## 2019-03-02 DIAGNOSIS — C719 Malignant neoplasm of brain, unspecified: Secondary | ICD-10-CM

## 2019-03-02 HISTORY — DX: Other hemolytic-uremic syndrome: D59.39

## 2019-03-02 HISTORY — DX: Iron deficiency anemia secondary to blood loss (chronic): D50.0

## 2019-03-02 HISTORY — DX: Other specified disorders of brain: G93.89

## 2019-03-02 HISTORY — DX: Hemolytic-uremic syndrome: D59.3

## 2019-03-02 LAB — IRON AND TIBC
Iron: 64 ug/dL (ref 28–170)
Saturation Ratios: 18 % (ref 10.4–31.8)
TIBC: 365 ug/dL (ref 250–450)
UIBC: 301 ug/dL

## 2019-03-02 LAB — CBC WITH DIFFERENTIAL/PLATELET
Abs Immature Granulocytes: 0.02 10*3/uL (ref 0.00–0.07)
Basophils Absolute: 0 10*3/uL (ref 0.0–0.1)
Basophils Relative: 1 %
Eosinophils Absolute: 0.3 10*3/uL (ref 0.0–0.5)
Eosinophils Relative: 5 %
HCT: 23.6 % — ABNORMAL LOW (ref 36.0–46.0)
Hemoglobin: 7.6 g/dL — ABNORMAL LOW (ref 12.0–15.0)
Immature Granulocytes: 0 %
Lymphocytes Relative: 17 %
Lymphs Abs: 1 10*3/uL (ref 0.7–4.0)
MCH: 30.9 pg (ref 26.0–34.0)
MCHC: 32.2 g/dL (ref 30.0–36.0)
MCV: 95.9 fL (ref 80.0–100.0)
Monocytes Absolute: 0.4 10*3/uL (ref 0.1–1.0)
Monocytes Relative: 7 %
Neutro Abs: 4.3 10*3/uL (ref 1.7–7.7)
Neutrophils Relative %: 70 %
Platelets: 152 10*3/uL (ref 150–400)
RBC: 2.46 MIL/uL — ABNORMAL LOW (ref 3.87–5.11)
RDW: 15.4 % (ref 11.5–15.5)
WBC: 6.1 10*3/uL (ref 4.0–10.5)
nRBC: 0 % (ref 0.0–0.2)

## 2019-03-02 LAB — BASIC METABOLIC PANEL
Anion gap: 12 (ref 5–15)
BUN: 83 mg/dL — ABNORMAL HIGH (ref 6–20)
CO2: 19 mmol/L — ABNORMAL LOW (ref 22–32)
Calcium: 8 mg/dL — ABNORMAL LOW (ref 8.9–10.3)
Chloride: 106 mmol/L (ref 98–111)
Creatinine, Ser: 7.43 mg/dL — ABNORMAL HIGH (ref 0.44–1.00)
GFR calc Af Amer: 8 mL/min — ABNORMAL LOW (ref >60)
GFR calc non Af Amer: 7 mL/min — ABNORMAL LOW (ref >60)
Glucose, Bld: 118 mg/dL — ABNORMAL HIGH (ref 70–99)
Potassium: 4.2 mmol/L (ref 3.5–5.1)
Sodium: 137 mmol/L (ref 135–145)

## 2019-03-02 LAB — RETICULOCYTES
Immature Retic Fract: 15.1 % (ref 2.3–15.9)
RBC.: 2.36 MIL/uL — ABNORMAL LOW (ref 3.87–5.11)
Retic Count, Absolute: 71.5 10*3/uL (ref 19.0–186.0)
Retic Ct Pct: 3 % (ref 0.4–3.1)

## 2019-03-02 LAB — PROTIME-INR
INR: 0.9 (ref 0.8–1.2)
Prothrombin Time: 11.6 seconds (ref 11.4–15.2)

## 2019-03-02 LAB — FOLATE: Folate: 8.2 ng/mL (ref >5.9)

## 2019-03-02 LAB — LACTATE DEHYDROGENASE: LDH: 586 U/L — AB (ref 98–192)

## 2019-03-02 LAB — SAVE SMEAR(SSMR), FOR PROVIDER SLIDE REVIEW

## 2019-03-02 LAB — CBG MONITORING, ED: Glucose-Capillary: 104 mg/dL — ABNORMAL HIGH (ref 70–99)

## 2019-03-02 LAB — VITAMIN B12: Vitamin B-12: 168 pg/mL — ABNORMAL LOW (ref 180–914)

## 2019-03-02 LAB — FERRITIN: Ferritin: 67 ng/mL (ref 11–307)

## 2019-03-02 MED ORDER — METOCLOPRAMIDE HCL 10 MG PO TABS
10.0000 mg | ORAL_TABLET | Freq: Once | ORAL | Status: AC
  Administered 2019-03-02: 10 mg via ORAL
  Filled 2019-03-02: qty 1

## 2019-03-02 MED ORDER — CLONIDINE HCL 0.1 MG PO TABS
0.1000 mg | ORAL_TABLET | ORAL | Status: DC | PRN
  Administered 2019-03-02: 0.1 mg via ORAL
  Filled 2019-03-02: qty 1

## 2019-03-02 MED ORDER — LABETALOL HCL 5 MG/ML IV SOLN: 20.0000 mg | Freq: Once | INTRAVENOUS | Status: DC

## 2019-03-02 MED ORDER — DIPHENHYDRAMINE HCL 25 MG PO CAPS
25.0000 mg | ORAL_CAPSULE | Freq: Once | ORAL | Status: AC
  Administered 2019-03-02: 25 mg via ORAL
  Filled 2019-03-02: qty 1

## 2019-03-02 MED ORDER — HYDRALAZINE HCL 20 MG/ML IJ SOLN
10.0000 mg | Freq: Once | INTRAMUSCULAR | Status: AC
  Administered 2019-03-02: 10 mg via INTRAVENOUS
  Filled 2019-03-02: qty 1

## 2019-03-02 MED ORDER — CLONIDINE HCL 0.2 MG PO TABS
0.2000 mg | ORAL_TABLET | Freq: Once | ORAL | Status: AC
  Administered 2019-03-02: 0.2 mg via ORAL
  Filled 2019-03-02: qty 1

## 2019-03-02 MED ORDER — FOLIC ACID 1 MG PO TABS
2.0000 mg | ORAL_TABLET | Freq: Every day | ORAL | Status: DC
  Administered 2019-03-02: 2 mg via ORAL
  Filled 2019-03-02: qty 2

## 2019-03-02 MED ORDER — LABETALOL HCL 100 MG PO TABS: 100.0000 mg | ORAL_TABLET | Freq: Two times a day (BID) | ORAL | 0 refills | Status: AC

## 2019-03-02 MED ORDER — LIDOCAINE-EPINEPHRINE (PF) 2 %-1:200000 IJ SOLN
20.0000 mL | Freq: Once | INTRAMUSCULAR | Status: DC
  Filled 2019-03-02: qty 20

## 2019-03-02 MED ORDER — SODIUM CHLORIDE 0.9 % IV SOLN
510.0000 mg | Freq: Once | INTRAVENOUS | Status: AC
  Administered 2019-03-02: 510 mg via INTRAVENOUS
  Filled 2019-03-02: qty 17

## 2019-03-02 MED ORDER — LABETALOL HCL 5 MG/ML IV SOLN
20.0000 mg | Freq: Once | INTRAVENOUS | Status: AC
  Administered 2019-03-02: 20 mg via INTRAVENOUS
  Filled 2019-03-02: qty 4

## 2019-03-02 MED ORDER — METOCLOPRAMIDE HCL 5 MG/ML IJ SOLN
10.0000 mg | Freq: Once | INTRAMUSCULAR | Status: AC
  Administered 2019-03-02: 10 mg via INTRAVENOUS
  Filled 2019-03-02: qty 2

## 2019-03-02 MED ORDER — AMLODIPINE BESYLATE 5 MG PO TABS: 5.0000 mg | ORAL_TABLET | Freq: Every day | ORAL | 0 refills | Status: AC

## 2019-03-02 MED ORDER — DEXAMETHASONE SODIUM PHOSPHATE 4 MG/ML IJ SOLN
20.0000 mg | Freq: Once | INTRAMUSCULAR | Status: AC
  Administered 2019-03-02: 20 mg via INTRAVENOUS
  Filled 2019-03-02 (×2): qty 5

## 2019-03-02 MED ORDER — LABETALOL HCL 100 MG PO TABS: 100.0000 mg | ORAL_TABLET | Freq: Two times a day (BID) | ORAL | 0 refills | Status: DC

## 2019-03-02 MED ORDER — LABETALOL HCL 5 MG/ML IV SOLN
20.0000 mg | Freq: Once | INTRAVENOUS | Status: DC
  Filled 2019-03-02: qty 4

## 2019-03-02 MED ORDER — LABETALOL HCL 5 MG/ML IV SOLN
20.0000 mg | Freq: Once | INTRAVENOUS | Status: AC
  Administered 2019-03-02: 20 mg via INTRAVENOUS

## 2019-03-02 MED ORDER — DIPHENHYDRAMINE HCL 50 MG/ML IJ SOLN
25.0000 mg | Freq: Once | INTRAMUSCULAR | Status: AC
  Administered 2019-03-02: 25 mg via INTRAVENOUS
  Filled 2019-03-02: qty 1

## 2019-03-02 NOTE — ED Triage Notes (Signed)
PT transported to MRI

## 2019-03-02 NOTE — Progress Notes (Signed)
NEUROSURGERY PROGRESS NOTE  Consulted regarding this patient who has had a headache for a couple weeks and some blurry visual changes. MRI revealed a brainstem glioma. Unfortunately this lesion is not in a location that could be biopsied safely since it is right near her brain stem. This would best be treated with palliative radiation and oncology follow up. Her vision changes are not likely coming from this lesion as much as it is her hypertension. No neurosurgery intervention needed. Please call if we can be of further assistance.   Temp:  [98.1 F (36.7 C)-98.7 F (37.1 C)] 98.1 F (36.7 C) (03/30 1509) Pulse Rate:  [78-97] 78 (03/30 1509) Resp:  [16] 16 (03/30 1323) BP: (255-267)/(161-179) 261/161 (03/30 1509) SpO2:  [100 %] 100 % (03/30 1509) Weight:  [63.5 kg] 63.5 kg (03/30 1204)   Jenna Chiquito, NP 03/02/2019 5:31 PM

## 2019-03-02 NOTE — Consult Note (Addendum)
Neurology Consultation  Reason for Consult: Headache Referring Physician: Dr. Deno Etienne  CC: Headaches  History is obtained from: Patient, chart  HPI: Jenna Duran is a 33 y.o. 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 She says that she usually gets headaches which are migraines can happen anywhere in the head and are throbbing in nature but about 10 days ago she started a headache that was very global all around the head, pounding in nature and did not resolve with over-the-counter medications. She also started feeling generally weak.  Along with this headache, she also started having blurred vision.  She did not report any diplopia or double vision but said that her vision is blurred all over.  She can make out shapes but is not able to see the details of any of the objects. She has never had these symptoms in the past with her migraines. I was called by the emergency room to evaluate her.  I recommended getting an MRI of the brain with and without contrast but that could not be done because of her severely impaired renal function.  Hence an MRI of the brain without contrast was done. Review of systems is positive for nausea ever since this headache started. She was on treatment for HUS till about 1 year ago when she stopped it.  ROS:ROS was performed and is negative except as noted in the HPI.    Past Medical History:  Diagnosis Date  . Autosomal dominant atypical hemolytic uremic syndrome type 1 (AHUS) associated with mutation in [facility] gene ([place])   . Hypertension   . Renal disorder   . T.T.P. syndrome ([place])     Family History  Family history unknown: Yes   Social History:   reports that she has  quit smoking. Her smoking use included cigarettes. She has never used smokeless tobacco. She reports current drug use. Drug: Marijuana. She reports that she does not drink alcohol.   Medications No current facility-administered medications for this encounter.   Current Outpatient Medications:  .  losartan (COZAAR) 50 MG tablet, Take 1 tablet (50 mg total) by mouth daily., Disp: 30 tablet, Rfl: 6 .  NIFEdipine (ADALAT CC) 90 MG 24 hr tablet, Take 1 tablet (90 mg total) by mouth daily., Disp: 30 tablet, Rfl: 6  Exam: Current vital signs: BP (!) 255/166 (BP Location: Left Arm)   Pulse 80   Temp 98.6 F (37 C) (Oral)   Resp 16   Ht 5\' 5"  (1.651 m)   Wt 63.5 kg   LMP 03/02/2019   SpO2 100%   BMI 23.30 kg/m  Vital signs in last 24 hours: Temp:  [98.6 F (37 C)-98.7 F (37.1 C)] 98.6 F (37 C) (03/30 1323) Pulse Rate:  [80-97] 80 (03/30 1323) Resp:  [16] 16 (03/30 1323) BP: (255-267)/(166-179) 255/166 (03/30 1323) SpO2:  [100 %] 100 % (03/30 1323) Weight:  [63.5 kg] 63.5 kg (03/30 1204) General: Anxious looking woman in no acute distress. HEENT: Normocephalic atraumatic dry mucous membranes no lymphadenopathy Lungs: Clear to auscultation with no wheezes bilaterally CVS: S1-S2 heard regular rate rhythm Abdomen soft nondistended nontender Extremities warm well perfused Neurological exam Her mental status exam reveals alert awake oriented person to time place and person Her speech is  not dysarthric. Her naming comprehension and repetition are intact Cranial nerves: Pupils are equal round reactive to light, extraocular movements intact, visual fields are full, visual acuity is reduced to blurred vision-formal testing not performed, face symmetric, facial sensation intact, shoulder shrug intact, palate midline, tongue midline. Motor exam: 5/5 with no drift in all fours Sensory exam: Intact light touch with no extinction in all fours Coordination: Intact finger-nose-finger  laterally Gait testing deferred  Labs I have reviewed labs in epic and the results pertinent to this consultation are:  CBC    Component Value Date/Time   WBC 6.1 03/02/2019 1230   RBC 2.46 (L) 03/02/2019 1230   HGB 7.6 (L) 03/02/2019 1230   HGB 9.5 (L) 09/16/2018 1615   HCT 23.6 (L) 03/02/2019 1230   HCT 28.3 (L) 09/16/2018 1615   PLT 152 03/02/2019 1230   PLT 275 09/16/2018 1615   MCV 95.9 03/02/2019 1230   MCV 93 09/16/2018 1615   MCH 30.9 03/02/2019 1230   MCHC 32.2 03/02/2019 1230   RDW 15.4 03/02/2019 1230   RDW 13.3 09/16/2018 1615   LYMPHSABS 1.0 03/02/2019 1230   LYMPHSABS 1.2 09/16/2018 1615   MONOABS 0.4 03/02/2019 1230   EOSABS 0.3 03/02/2019 1230   EOSABS 0.2 09/16/2018 1615   BASOSABS 0.0 03/02/2019 1230   BASOSABS 0.0 09/16/2018 1615    CMP     Component Value Date/Time   NA 137 03/02/2019 1230   NA 140 09/16/2018 1615   K 4.2 03/02/2019 1230   CL 106 03/02/2019 1230   CO2 19 (L) 03/02/2019 1230   GLUCOSE 118 (H) 03/02/2019 1230   BUN 83 (H) 03/02/2019 1230   BUN 58 (H) 09/16/2018 1615   CREATININE 7.43 (H) 03/02/2019 1230   CREATININE 3.09 (H) 07/04/2017 1350   CALCIUM 8.0 (L) 03/02/2019 1230   PROT 6.0 09/16/2018 1615   ALBUMIN 4.3 09/16/2018 1615   AST 22 09/16/2018 1615   ALT 17 09/16/2018 1615   ALKPHOS 73 09/16/2018 1615   BILITOT <0.2 09/16/2018 1615   GFRNONAA 7 (L) 03/02/2019 1230   GFRNONAA 19 (L) 07/04/2017 1350   GFRAA 8 (L) 03/02/2019 1230   GFRAA 22 (L) 07/04/2017 1350   Imaging I have reviewed the images obtained: MRI examination of the brain without contrast with T2 hyperintense 2.4 x 2.5 x 2.2 cm mass lesion centered in the left midbrain and pons most consistent with brainstem glioma.  Postcontrast imaging cannot be performed due to deranged renal function.  Assessment: 33 year old presenting with unremitting headaches different from her usual migraines for the last 10 to 12 days along with blurred vision and nausea. 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   Impression: Evaluate for brainstem glioma Hypertensive urgency   Recommendations: Spinal tap-sent for regular tests and cytology and flow cytometry-orders put in.  ER to perform LP. High volume tap will be needed.  Oncology consult-already in place due to her HUS heme-onc is involved in her care. I would recommend discussing the case with Dr. Mickeal Skinner for further management of her brainstem mass.  Symptomatic headache management per ED Symptomatic hypertension management per ED  Plan relayed in person to Dr. Tyrone Nine and Lenn Sink, [facility]  Neurology will be available as needed.  -- Amie Portland, MD [facility] Pager: [phone] If 7pm to 7am, please call on call as listed on AMION.

## 2019-03-02 NOTE — Consult Note (Addendum)
[place]  [phone] Fax:([phone] [place]  Referring MD:  Dr. Deno Etienne  Reason for Referral: Hemolytic uremic syndrome  HPI: 33 year old female with a significant past medical history of hypertension, a HUS, CKD presents today with complaints of visual loss.  Patient notes approximate 1.5 weeks ago she developed a migraine, she notes this is typical of previous.  She initially had a throbbing headache with no associated vision loss.  She reports that towards the end of her migraine she developed visual changes worse on the left but also decreased on the left as well.  Patient notes since that time she has had intermittent headache, nonsevere both frontal and posterior.  She denies any associated neurological deficits, neck pain or stiffness.  She denies any abnormal bruising or bleeding, denies any vaginal bleeding and notes her menstrual cycle was approximately 1 month ago.  She notes she has been weaker than normal recently.  Does not wear contacts or glasses and notes her vision is 20/20 normally.  She reports that her last routine visit was approximately 6 months ago, she has not seen a nephrologist recently as she was living in [place] for the last several years.  The patient reports that she has been treated for TTP going back to approximately 2009.  She was treated with plasmapheresis, steroids, and Rituxan.  Review of records in [place] show that she was admitted to [facility] in February 2017 for acute kidney injury and uncontrolled HTN. 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 On arrival underwent plasma exchange 2/9 based on high clinical suspicion for TTP; ADAMTS13 later returned 108% so this was not repeated. It appears as though she had several follow-up appointments scheduled  with both nephrology and hematology but missed these appointments both in 2017 and 2019.  Today, the patient reports that she did not have TTP but instead was diagnosed with aHUS.  This diagnosis was made in [place].  The patient reports that she had been receiving Soliris but stopped taking this about 1 year ago. Reports that she stopped this because she want to see how she would do without it.  Her Soliris was being administered by nephrology.  Today the patient reports having double vision.  Has a mild headache.  Denies chest pain shortness of breath.  Has generalized weakness, but no weakness of her etremities. No change in speech.  Reports that her blood pressure always remains high. Denies bleeding. Labs performed in the emergency room were significant for hemoglobin of 7.5, BUN 83, creatinine 7.43, LDH 586,, absolute reticulocyte count 71.5.  Hematology was asked see the patient for recommendations regarding her aHUS.   Past Medical History:  Diagnosis Date   Autosomal dominant atypical hemolytic uremic syndrome type 1 (AHUS) associated with mutation in [facility] gene ([place])    Hypertension    Renal disorder    T.T.P. syndrome ([place])   :  History reviewed. No pertinent surgical history.:  CURRENT MEDS: No current facility-administered medications for this encounter.    Current Outpatient Medications  Medication Sig Dispense Refill   losartan (COZAAR) 50 MG tablet Take 1 tablet (50 mg total) by mouth daily. 30 tablet 6   NIFEdipine (ADALAT CC) 90 MG 24 hr tablet Take 1 tablet (90 mg total) by mouth daily. 30 tablet 6     No Known Allergies:  Family History  Family history unknown: Yes  :   Social History   Socioeconomic History   Marital status: Married    Spouse name: Not on file   Number of children: 1   Years of education: Not on file   Highest education level: Not on file  Occupational History   Not on file  Social Needs   Financial resource strain: Not  on file   Food insecurity:    Worry: Not on file    Inability: Not on file   Transportation needs:    Medical: Not on file    Non-medical: Not on file  Tobacco Use   Smoking status: Never Smoker   Smokeless tobacco: Never Used  Substance and Sexual Activity   Alcohol use: Yes    Comment: Drink several glasses of wine weekly   Drug use: Yes    Types: Marijuana   Sexual activity: Yes    Birth control/protection: None  Lifestyle   Physical activity:    Days per week: Not on file    Minutes per session: Not on file   Stress: Not on file  Relationships   Social connections:    Talks on phone: Not on file    Gets together: Not on file    Attends religious service: Not on file    Active member of club or organization: Not on file    Attends meetings of clubs or organizations: Not on file    Relationship status: Not on file   Intimate partner violence:    Fear of current or ex partner: Not on file    Emotionally abused: Not on file    Physically abused: Not on file    Forced sexual activity: Not on file  Other Topics Concern   Not on file  Social History Narrative   Not on file  :  REVIEW OF SYSTEM:  The rest of the 14-point review of systems was negative.   Exam: Patient Vitals for the past 24 hrs:  BP Temp Temp src Pulse Resp SpO2 Height Weight  03/02/19 1323 (!) 255/166 98.6 F (37 C) Oral 80 16 100 % -- --  03/02/19 1204 -- -- -- -- -- -- 5\' 5"  (1.651 m) 140 lb (63.5 kg)  03/02/19 1202 (!) 267/179 98.7 F (37.1 C) Oral 97 16 100 % -- --    General:  well-nourished in no acute distress.  Eyes:  no scleral icterus.  ENT:  There were no oropharyngeal lesions.  Neck was without thyromegaly.  Lymphatics:  Negative cervical, supraclavicular or axillary adenopathy.  Respiratory: lungs were clear bilaterally without wheezing or crackles.  Cardiovascular:  Regular rate and rhythm, S1/S2, without murmur, rub or gallop.  There was no pedal edema.  GI:  abdomen was  soft, flat, nontender, nondistended, without organomegaly.  Musculoskeletal:  no spinal tenderness of palpation of vertebral spine.  Skin exam was without ecchymosis, petechiae.  Neuro exam was nonfocal.  Complete neuro exam was performed by neurology in my presence.  Attention was good.   Language was appropriate.  Mood was normal without depression.  Speech was not pressured.  Thought content was not tangential.    LABS:  Lab Results  Component Value Date   WBC 6.1 03/02/2019   HGB 7.6 (L) 03/02/2019   HCT 23.6 (L) 03/02/2019   PLT 152 03/02/2019   GLUCOSE 118 (H) 03/02/2019   ALT 17 09/16/2018   AST 22 09/16/2018   NA 137 03/02/2019   K  4.2 03/02/2019   CL 106 03/02/2019   CREATININE 7.43 (H) 03/02/2019   BUN 83 (H) 03/02/2019   CO2 19 (L) 03/02/2019   HGBA1C 4.4 07/04/2017   MICROALBUR 67.4 07/04/2017    Mr Brain Wo Contrast  Result Date: 03/02/2019 CLINICAL DATA:  New onset visual loss.  Headaches 1 week. EXAM: MRI HEAD WITHOUT CONTRAST MRA HEAD WITHOUT CONTRAST TECHNIQUE: Multiplanar, multiecho pulse sequences of the brain and surrounding structures were obtained without intravenous contrast. Angiographic images of the head were obtained using MRV technique without contrast. COMPARISON:  CT head without contrast 07/01/2017 FINDINGS: MRI HEAD FINDINGS Brain: Focal T2 signal hyperintensity is evident in the midbrain and pons on the left. This is a masslike lesion measuring 2.4 x 2.5 x 2.2 cm. There is facilitated diffusion associated with this lesion. No acute infarct is present. Moderate periventricular white matter changes are advanced for age. No other focal lesion is present. Ventricles are normal size. There are some T2 signal changes within the superior cerebellum. Vascular: Flow is present in the major intracranial arteries. Skull and upper cervical spine: The craniocervical junction is normal. Upper cervical spine is within normal limits. Marrow signal is unremarkable.  Sinuses/Orbits: The paranasal sinuses and mastoid air cells are clear. The globes and orbits are within normal limits. MRV HEAD FINDINGS Dural sinuses are patent.  The right transverse sinus is dominant. IMPRESSION: 1. T2 hyperintense 2.4 x 2.5 x 2.2 cm mass lesion centered in the left midbrain and pons most consistent with a brainstem glioma. 2. Age advanced white matter disease. Question underlying vascular pathology versus complicated migraine headaches. Electronically Signed   By: San Morelle M.D.   On: 03/02/2019 15:06   Mr Mrv Head Wo Cm  Result Date: 03/02/2019 CLINICAL DATA:  New onset visual loss.  Headaches 1 week. EXAM: MRI HEAD WITHOUT CONTRAST MRA HEAD WITHOUT CONTRAST TECHNIQUE: Multiplanar, multiecho pulse sequences of the brain and surrounding structures were obtained without intravenous contrast. Angiographic images of the head were obtained using MRV technique without contrast. COMPARISON:  CT head without contrast 07/01/2017 FINDINGS: MRI HEAD FINDINGS Brain: Focal T2 signal hyperintensity is evident in the midbrain and pons on the left. This is a masslike lesion measuring 2.4 x 2.5 x 2.2 cm. There is facilitated diffusion associated with this lesion. No acute infarct is present. Moderate periventricular white matter changes are advanced for age. No other focal lesion is present. Ventricles are normal size. There are some T2 signal changes within the superior cerebellum. Vascular: Flow is present in the major intracranial arteries. Skull and upper cervical spine: The craniocervical junction is normal. Upper cervical spine is within normal limits. Marrow signal is unremarkable. Sinuses/Orbits: The paranasal sinuses and mastoid air cells are clear. The globes and orbits are within normal limits. MRV HEAD FINDINGS Dural sinuses are patent.  The right transverse sinus is dominant. IMPRESSION: 1. T2 hyperintense 2.4 x 2.5 x 2.2 cm mass lesion centered in the left midbrain and pons most  consistent with a brainstem glioma. 2. Age advanced white matter disease. Question underlying vascular pathology versus complicated migraine headaches. Electronically Signed   By: San Morelle M.D.   On: 03/02/2019 15:06     ASSESSMENT AND PLAN:  This is a 32 year old female with aHUS.  She was diagnosed in [place] and has been on Soliris in the past.  Reports that she has been off this medication for approximately 1 year.  The patient now presents with anemia and worsening renal function.  She also has hypertensive urgency.    Labs from today have been reviewed.  Hemoglobin is 7.6.  She has no active bleeding.  The patient does not qualify to receive a blood transfusion at this time due to a blood shortage we cannot get blood loss her hemoglobin is less than 7.0.  We will continue to monitor this.  The patient states that she does not want to stay in the hospital and we can see her as an outpatient for further management of her aHUS.  An MRI of the brain was performed today due to her new onset of visual loss and history of headaches. The MRI showed a 2.4 x 2.5 x 2.2 cm mass centered in the left midbrain and pons most consistent with a brainstem glioma. Neurology has seen the patient and she has no neuro deficits noted on her exam.  They have recommended a lumbar puncture and evaluation by Dr. Mickeal Skinner of neuro oncology.   For hypertension, she has been given a dose of labetalol.  Blood pressure remains significantly elevated.  Neurology has recommended that her systolic blood pressure be less than 180 before considering discharge.  I have spoken with Dr. Marin Olp who is the on-call physician who will see this patient later today and make further recommendations.  Thank you for this referral.  Mikey Bussing, DNP, AGPCNP-BC, AOCNP   ADDENDUM: I saw and examined Jenna Duran.  I must say that she is quite delightful and was very honest I think with me about what was going on with  her.  I agree with the above recommendations from [place].  I think the biggest problem clearly is this brain mass.  It appears to be in the left midbrain and pons.  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  We really need a biopsy of this.  I really do not think that a lumbar puncture is going to give any useful information regarding this mass.  If this mass was a primary CNS lymphoma then possibly an LP could help.  However, radiology is quite good at calling these gliomas.  I would clearly get neurosurgery involved.  Again, I think a biopsy is needed.  Unfortunately, I doubt neurosurgery would biopsy of her given her blood pressure issues, renal failure, and anemia.  We got records from the [facility] from [place], [place].  She has been seen up there for hemolytic uremic syndrome.  She was getting Soliris.  She has not had the Soliris for about a year because she lost her insurance.  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  She has numerous schistocytes.  She has polychromasia.  I see no nucleated red blood cells.  A lot of the microangiopathic changes might be from her incredibly high blood pressure.  This really needs to be controlled.  I am surprised that she is not on anything IV to try to bring the blood pressure down.  Maybe the fact that she is not on anything IV stems from her wanting to go home.  I do think that part of the issue is that she is iron deficient.  Her corrected reticulocyte count is quite low for hemolytic anemia.  As such, I think IV iron would help as well as folic acid.  She definitely needs to get back onto the Soliris.  She probably needs to get started from induction therapy.  This would be weekly Soliris for 4 weeks and then treatment every 2  weeks.  I will have to try to see if we cannot get her Soliris from the company.  The other option is the new agent Ultomiris.  Her LDH is quite high which  would suggest the HUS as a active issue.  It is incredibly unusual to see to totally separate hematologic/oncologic issues in such a young patient.  I just wonder if there is not some kind of genetic issue that she has.  She really does not know much about her family.  She smokes very little.  She does not drink.  I know that it would be best for her to be admitted.  I talked to her about this.  However, she is set on going home.  She understands the risk that she goes through if she goes home.  She could easily bleed out from this brain tumor if her blood pressure is not better controlled.  She could have some type of myocardial event if she becomes more anemic.  She could pass out if she becomes more anemic.  I do not see any swelling in the brain so I am not sure we need to get her on dexamethasone.  May be given her a dose in the ER would not be a bad idea.  Her vision still is not any better from what she tells me.  Hopefully she will get some of her vision back.  Again, I was very impressed with her demeanor.  She seemed very nice and was very willing to talk about her situation.  Hopefully, we will be able to help her out.  I talked to the emergency room doctor.  I told him that I really do not think that she had to go through a lumbar puncture.  I thought that would be very low yield.  If, for some reason, she is admitted to the hospital, we will follow-up.  I will see about getting treatment for the HUS.  I spent about an hour or so with her.  All the time spent face-to-face.  I reviewed her lab work.  We looked at her MRI.  I try to point out where the tumor was.  I did give her my business card.  I will do my best to try to help with the hemolytic uremic syndrome portion of her problems.  Hopefully, she will consent to a biopsy of this midbrain tumor.  I will know if this is some that could be totally removed or not.   Lattie Haw, MD  Psalm 18:2

## 2019-03-02 NOTE — ED Triage Notes (Signed)
PT vomited  After taking po meds . Nwe orders placed.

## 2019-03-02 NOTE — ED Triage Notes (Signed)
Neuro and  Hematology  Consults at bed side to review test  Results with PT.

## 2019-03-02 NOTE — ED Notes (Signed)
Patient verbalizes understanding of discharge instructions. Opportunity for questioning and answers were provided. Armband removed by staff, pt discharged from ED.  

## 2019-03-02 NOTE — ED Notes (Signed)
Pt aware of risks of leaving the hospital due to her HTN and diagnosis. EDP at bedside prior to speak to patient. Pt ambulatory to discharge with no difficulty.

## 2019-03-02 NOTE — ED Triage Notes (Signed)
Call hemotology  [phone]  When Pt returns from MRI

## 2019-03-02 NOTE — Consult Note (Signed)
[facility]    NEPHROLOGY CONSULTATION NOTE  PATIENT ID:  Jenna Duran, DOB:  [id number]  HPI: The patient is a 33 y.o. 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  She was found to have a systolic blood pressure of 260 on arrival.  She has previously been followed by nephrology in [place].  Review of records from care everywhere reveal her most recent serum creatinine in the 3 range.  She reports not having blood work in approximately 6 months.  She reports that she has not taken her outpatient medications in some time as well as she reported her blood pressure was fine.  She reports that she does have a history of chronic kidney disease, but reports that her kidney function always improves.  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  She was on treatment for HUS until approximately 1 year ago when she discontinued the medication.  Labs from the emergency department reveal a creatinine of 7.43.  She denies any nausea, vomiting, fatigue, or other uremic symptoms.  Renal consultation has been called for worsening renal function.   Past Medical History:  Diagnosis Date  . Autosomal dominant atypical hemolytic uremic syndrome type 1 (AHUS) associated with mutation in [facility] gene ([place])   . Hypertension   . Renal disorder   . T.T.P. syndrome ([place])     History reviewed. No pertinent surgical history.  Family History  Family history unknown: Yes  No family history of chronic kidney disease or end-stage kidney disease.  Social History   Tobacco Use  . Smoking status: Never Smoker  . Smokeless tobacco: Never Used  Substance Use Topics  . Alcohol use: Yes    Comment: Drink several  glasses of wine weekly  . Drug use: Yes    Types: Marijuana    REVIEW OF SYSTEMS: General:  no fatigue, no weakness Head: Positive headaches  eyes: Positive blurry vision ENT:  no sore throat Neck:  no masses CV:  no chest pain, no orthopnea Lungs:  no shortness of breath, no cough GI:  no nausea or vomiting, no diarrhea GU:  no dysuria or hematuria Skin:  no rashes or lesions Neuro:  no focal numbness or weakness Psych:  no depression or anxiety    PHYSICAL EXAM:  Vitals:   03/02/19 1323 03/02/19 1509  BP: (!) 255/166 (!) 261/161  Pulse: 80 78  Resp: 16   Temp: 98.6 F (37 C) 98.1 F (36.7 C)  SpO2: 100% 100%   No intake/output data recorded.   General:  AAOx3 NAD HEENT: MMM [place] AT anicteric sclera Neck:  No JVD, no adenopathy CV:  Heart RRR  Lungs:  L/S CTA bilaterally Abd:  abd SNT/ND with normal BS GU:  Bladder non-palpable Extremities:  No LE edema. Skin:  No skin rash Psych:  normal mood and affect Neuro:  no focal deficits   CURRENT MEDICATIONS:  . labetalol  20 mg Intravenous Once  . lidocaine-EPINEPHrine  20 mL Infiltration Once     HOME MEDICATIONS:  Prior to Admission medications   Medication Sig Start Date End Date Taking? Authorizing Provider  LABETALOL HCL PO Take 1 tablet by mouth daily.   Yes [provider]  losartan (COZAAR) 50 MG tablet  Take 1 tablet (50 mg total) by mouth daily. Patient not taking: Reported on 03/02/2019 09/16/18   Elsie Stain, MD  NIFEdipine (ADALAT CC) 90 MG 24 hr tablet Take 1 tablet (90 mg total) by mouth daily. Patient not taking: Reported on 03/02/2019 09/16/18   Elsie Stain, MD       LABS:  CBC Latest Ref Rng & Units 03/02/2019 09/16/2018 07/04/2017  WBC 4.0 - 10.5 K/uL 6.1 4.3 5.9  Hemoglobin 12.0 - 15.0 g/dL 7.6(L) 9.5(L) 10.2(L)  Hematocrit 36.0 - 46.0 % 23.6(L) 28.3(L) 30.8(L)  Platelets 150 - 400 K/uL 152 275 142    CMP Latest Ref Rng & Units 03/02/2019 09/16/2018 07/04/2017   Glucose 70 - 99 mg/dL 118(H) 79 81  BUN 6 - 20 mg/dL 83(H) 58(H) 36(H)  Creatinine 0.44 - 1.00 mg/dL 7.43(H) 3.49(H) 3.09(H)  Sodium 135 - 145 mmol/L 137 140 141  Potassium 3.5 - 5.1 mmol/L 4.2 5.0 4.3  Chloride 98 - 111 mmol/L 106 105 101  CO2 22 - 32 mmol/L 19(L) 18(L) 25  Calcium 8.9 - 10.3 mg/dL 8.0(L) 8.3(L) 8.7  Total Protein 6.0 - 8.5 g/dL - 6.0 5.5(L)  Total Bilirubin 0.0 - 1.2 mg/dL - <0.2 0.5  Alkaline Phos 39 - 117 IU/L - 73 53  AST 0 - 40 IU/L - 22 41(H)  ALT 0 - 32 IU/L - 17 34(H)    Lab Results  Component Value Date   CALCIUM 8.0 (L) 03/02/2019       Component Value Date/Time   COLORURINE YELLOW 07/01/2017 1724   APPEARANCEUR CLEAR 07/01/2017 1724   LABSPEC 1.020 07/04/2017 1330   PHURINE 7.5 07/04/2017 1330   GLUCOSEU NEGATIVE 07/04/2017 1330   HGBUR MODERATE (A) 07/04/2017 1330   BILIRUBINUR NEGATIVE 07/04/2017 1330   KETONESUR NEGATIVE 07/04/2017 1330   PROTEINUR >=300 (A) 07/04/2017 1330   UROBILINOGEN 0.2 07/04/2017 1330   NITRITE NEGATIVE 07/04/2017 1330   LEUKOCYTESUR NEGATIVE 07/04/2017 1330   No results found for: PHART, PCO2ART, PO2ART, HCO3, TCO2, ACIDBASEDEF, O2SAT     Component Value Date/Time   IRON 64 03/02/2019 1448   IRON 71 09/16/2018 1615   TIBC 365 03/02/2019 1448   FERRITIN 67 03/02/2019 1448   IRONPCTSAT 18 03/02/2019 1448       ASSESSMENT/PLAN:     Problem List Items Addressed This Visit      Cardiovascular and Mediastinum   HTN (hypertension) - Primary   Relevant Medications   labetalol (NORMODYNE,TRANDATE) injection 20 mg (Completed)   labetalol (NORMODYNE,TRANDATE) injection 20 mg   LABETALOL HCL PO    Other Visit Diagnoses    Visual loss          1.  Chronic kidney disease stage IV with a baseline serum creatinine of 3.49 from September 16, 2018.  I suspect this is on the basis of hypertension and atypical HUS.  2.  Acute kidney injury versus progression of chronic kidney disease.  Certainly, malignant hypertension  has likely caused worsening kidney function.  I counseled her at length regarding the importance of monitoring her blood pressures and following up with nephrology.  She reports no uremic symptoms, and does not want to stay in the hospital.  3.  Malignant hypertension /hypertensive emergency.  She has received IV labetalol.  Would resume her outpatient medications with the exception of losartan.  She ideally would stay in the hospital for a Cardene drip to better reduce her blood pressure over time, but she is refusing to stay.  I again stressed the importance of tight blood pressure control and follow-up in the outpatient setting.  4.  Atypical HUS.  Platelet count is okay today.  Oncology is following.      [place], DO, [place]

## 2019-03-02 NOTE — ED Provider Notes (Signed)
[facility] EMERGENCY DEPARTMENT Provider Note   CSN: [id number] Arrival date & time: 03/02/19  1154   History   Chief Complaint Chief Complaint  Patient presents with  . Eye Problem    HPI Jenna Duran is a 33 y.o. female.     HPI   33 year old female with a significant past medical history of hypertension, a HUS, CKD presents today with complaints of visual loss.  Patient notes approximate 1.5 weeks ago she developed a migraine, she notes this is typical of previous.  She notes a throbbing headache with no associated vision loss.  She reports that towards the end of her migraine she developed visual changes worse on the left but also decreased on the left as well.  Patient notes since that time she has had intermittent headache, nonsevere both frontal and posterior.  She denies any associated neurological deficits, neck pain or stiffness.  She denies any abnormal bruising or bleeding, denies any vaginal bleeding and notes her menstrual cycle was approximately 1 month ago.  She notes she has been weaker than normal recently.  Does not wear contacts or glasses and notes her vision is 20/20 normally.  She reports that her last routine visit was approximately 6 months ago, she has not seen a nephrologist recently as she was living in [place] for the last several years.   Past Medical History:  Diagnosis Date  . Autosomal dominant atypical hemolytic uremic syndrome type 1 (AHUS) associated with mutation in [facility] gene ([place])   . Hypertension   . Renal disorder   . T.T.P. syndrome [address])     Patient Active Problem List   Diagnosis Date Noted  . Autosomal dominant atypical hemolytic uremic syndrome type 1 (AHUS) associated with mutation in [facility] gene ([place]) 09/16/2018  . CKD (chronic kidney disease) stage 4, GFR 15-29 ml/min (HCC) 09/16/2018  . Headache 01/09/2016  . HTN (hypertension) 05/21/2013    History reviewed. No pertinent surgical history.   OB History    No obstetric history on file.      Home Medications    Prior to Admission medications   Medication Sig Start Date End Date Taking? Authorizing Provider  losartan (COZAAR) 50 MG tablet Take 1 tablet (50 mg total) by mouth daily. 09/16/18   Elsie Stain, MD  NIFEdipine (ADALAT CC) 90 MG 24 hr tablet Take 1 tablet (90 mg total) by mouth daily. 09/16/18   Elsie Stain, MD    Family History Family History  Family history unknown: Yes    Social History Social History   Tobacco Use  . Smoking status: Former Smoker    Types: Cigarettes  . Smokeless tobacco: Never Used  Substance Use Topics  . Alcohol use: No  . Drug use: Yes    Types: Marijuana     Allergies   Patient has no known allergies.   Review of Systems Review of Systems  All other systems reviewed and are negative.    Physical Exam Updated Vital Signs BP (!) 255/166 (BP Location: Left Arm)   Pulse 80   Temp 98.6 F (37 C) (Oral)   Resp 16   Ht 5\' 5"  (1.651 m)   Wt 63.5 kg   LMP 03/02/2019   SpO2 100%   BMI 23.30 kg/m   Physical Exam Vitals signs and nursing note reviewed.  Constitutional:      Appearance: She is well-developed.  HENT:     Head: Normocephalic and atraumatic.  Eyes:  General: No scleral icterus.       Right eye: No discharge.        Left eye: No discharge.     Conjunctiva/sclera: Conjunctivae normal.     Pupils: Pupils are equal, round, and reactive to light.     Comments: Pupils are equal round and reactive to light, cornea's are clear with no opacities, no conjunctival injection extraocular movements are intact and pain-free - Vision grossly decreased bilateral  Neck:     Musculoskeletal: Normal range of motion.     Vascular: No JVD.     Trachea: No tracheal deviation.  Pulmonary:     Effort: Pulmonary effort is normal.     Breath sounds: No stridor.  Neurological:     Mental Status: She is alert and oriented to person, place, and time.     Coordination:  Coordination normal.  Psychiatric:        Behavior: Behavior normal.        Thought Content: Thought content normal.        Judgment: Judgment normal.      ED Treatments / Results  Labs (all labs ordered are listed, but only abnormal results are displayed) Labs Reviewed  CBC WITH DIFFERENTIAL/PLATELET - Abnormal; Notable for the following components:      Result Value   RBC 2.46 (*)    Hemoglobin 7.6 (*)    HCT 23.6 (*)    All other components within normal limits  BASIC METABOLIC PANEL - Abnormal; Notable for the following components:   CO2 19 (*)    Glucose, Bld 118 (*)    BUN 83 (*)    Creatinine, Ser 7.43 (*)    Calcium 8.0 (*)    GFR calc non Af Amer 7 (*)    GFR calc Af Amer 8 (*)    All other components within normal limits  LACTATE DEHYDROGENASE - Abnormal; Notable for the following components:   LDH 586 (*)    All other components within normal limits  BODY FLUID CULTURE  GRAM STAIN  RETICULOCYTES  FERRITIN  VITAMIN B12  FOLATE  IRON AND TIBC  SAVE SMEAR (SSMR)  PROTIME-INR  SYNOVIAL CELL COUNT + DIFF, W/ CRYSTALS  GLUCOSE, BODY FLUID OTHER  CBG MONITORING, ED    EKG None  Radiology No results found.  Procedures Procedures (including critical care time)  CRITICAL CARE Performed by: Stevie Kern Aleynah Rocchio   Total critical care time: 80 minutes  Critical care time was exclusive of separately billable procedures and treating other patients.  Critical care was necessary to treat or prevent imminent or life-threatening deterioration.  Critical care was time spent personally by me on the following activities: development of treatment plan with patient and/or surrogate as well as nursing, discussions with consultants, evaluation of patient's response to treatment, examination of patient, obtaining history from patient or surrogate, ordering and performing treatments and interventions, ordering and review of laboratory studies, ordering and  review of radiographic studies, pulse oximetry and re-evaluation of patient's condition.  Medications Ordered in ED Medications  metoCLOPramide (REGLAN) tablet 10 mg (10 mg Oral Given 03/02/19 1257)  diphenhydrAMINE (BENADRYL) capsule 25 mg (25 mg Oral Given 03/02/19 1257)  labetalol (NORMODYNE,TRANDATE) injection 20 mg (20 mg Intravenous Given 03/02/19 1258)  metoCLOPramide (REGLAN) injection 10 mg (10 mg Intravenous Given 03/02/19 1318)  diphenhydrAMINE (BENADRYL) injection 25 mg (25 mg Intravenous Given 03/02/19 1318)     Initial Impression / Assessment and Plan / ED Course  I have reviewed  the triage vital signs and the nursing notes.  Pertinent labs & imaging results that were available during my care of the patient were reviewed by me and considered in my medical decision making (see chart for details).        Labs:   Imaging:  Consults: Neurology- Dr. Rory Percy, Nephrology- Dr. Grayland Ormond, Oncology Dr. Marin Olp   Therapeutics: Labetalol, Reglan, diphenhydramine   Discharge Meds:   Assessment/Plan:   33 year old female presents today with acute visual changes.  Patient presents with severely elevated blood pressure at 276/179 with neurological changes.  Neurology was consulted who recommended MRI.  Given patient's previous creatinine MRI without contrast was ordered.  Patient has very minimal headache at this time, she will given Reglan and Benadryl along with labetalol for blood pressure control.  Patient's labs returned showing a significant elevation in her creatinine at 7.43 and a GFR of 8, this is decreased from GFR of 19 and a creatinine of 3.49 in October 2019.  Patient has no objective signs of bleeding including bruising, dark stools.  Given her history of HUS this is concerning although her platelets showed no significant changes.  Patient does not have a nephrologist here in town and has been managed by her primary care for blood pressure support.  I discussed the case with Dr.  Grayland Ormond of nephrology who recommended discussing case with heme-onc, given her several abrupt changes in both labs and neurological changes patient will need inpatient management for ongoing issues.  Case was also discussed with Dr. Marin Olp of oncology will see patient as an inpatient.  Initial MRI reviewed by neurology with concern for brainstem mass.  Patient will need neurosurgery consultation, lumbar puncture and hospital admission.  Care assumed by attending physician Deno Etienne for further management and admission.    Final Clinical Impressions(s) / ED Diagnoses   Final diagnoses:  Hypertension, unspecified type  Visual loss    ED Discharge Orders    None       Okey Regal, PA-C 03/02/19 [place], DO 03/02/19 [place], DO 03/03/19 1313

## 2019-03-02 NOTE — Discharge Instructions (Addendum)
Your blood pressure is very high, with your vision loss this is concerning for something called hypertensive emergency.  This likely needs to be treated very aggressively with blood pressure medications and usually in a hospitalized setting so that be monitored.  The risk of not doing this is permanent visual loss, a severe stroke that could leave you otherwise permanently disabled, or could potentially kill you.  Excessively high blood pressure can also stress your heart and may have a heart attack or could make your renal function turn into renal failure.  Please return to the hospital at any time that you wish to be treated for this and we will be happy to see you again start you on medications.  If you choose not to return to the hospital please follow-up with neurosurgeons, the oncologist, the nephrologist, and your family physician.

## 2019-03-02 NOTE — ED Triage Notes (Signed)
PT reports blurred vision this AM  And floaters. Pt reported last week having a migraine. Pt denies Migraine today.

## 2019-03-02 NOTE — ED Provider Notes (Signed)
I was asked to discharge the patient following a repeat dose of labetalol.  Patient has been in the emergency department for an extended amount of time with extensive work-up unfortunately diagnosing new brainstem glioma along with severe acute on chronic kidney disease.  Nephrology, neurosurgery, have all evaluated the patient, as well as oncology.  She has an extensive past medical history of hypertension with documented pressures in the 220s over 160s for asymptomatic visits, but she does appear more hypertensive today.  She remained hypertensive after labetalol so I gave an additional dose of clonidine as well as hydralazine and her blood pressure is now 240s over 110s.  This appears to be more at her baseline.  Dr. Tyrone Nine had an extensive discussion with the patient regarding admission, as well as multiple specialists, and she repeatedly refuses any further evaluation.  She even refuses additional blood pressure measurements.  I additionally attempted to fully discuss my concern that she is in acute renal failure with a brainstem mass and is at an extremely high risk of spontaneous bleed and subsequent death.  The patient is awake, alert, oriented, and demonstrates full decision-making capacity.  She is able to fully voice the entire course of her ED visit, the physicians that she has met with, as well as the diagnosis of a brain tumor and the risks that she is going to develop worsening renal failure, brain bleed, and death from her blood pressure. She again refuses any further observation in ED, admission, or alternative management.   Date: 03/02/2019 Patient: Jenna Duran Admitted: 03/02/2019 11:55 AM Attending Provider: Duffy Bruce, MD  [name] or her authorized caregiver has made the decision for the patient to leave the emergency department against the advice of Duffy Bruce, MD.  She or her authorized caregiver has been informed and understands the inherent risks, including death.   She or her authorized caregiver has decided to accept the responsibility for this decision. [name] and all necessary parties have been advised that she may return for further evaluation or treatment. Her condition at time of discharge was Stable.  [name] had current vital signs as follows:  Blood pressure (!) 206/138, pulse 74, temperature 98.1 F (36.7 C), temperature source Oral, resp. rate 18, height '5\' 5"'  (1.651 m), weight 63.5 kg, last menstrual period 03/02/2019, SpO2 100 %.   [name] or her authorized caregiver has signed the Leaving Against Medical Advice form prior to leaving the department.  [name] 03/02/2019       Duffy Bruce, MD 03/02/19 2055

## 2019-03-02 NOTE — ED Triage Notes (Signed)
Pt reporting she does not  want to be admitted.

## 2019-03-05 ENCOUNTER — Other Ambulatory Visit: Payer: Self-pay

## 2019-06-06 IMAGING — MR MR MRV HEAD WITHOUT CONTRAST
13 of 17 series · 23 of 48 positions shown · non-contrast
Comparison: CT head without contrast 07/01/2017

CLINICAL DATA: New onset visual loss.  Headaches 1 week.

EXAM:
MRI HEAD WITHOUT CONTRAST
MRA HEAD WITHOUT CONTRAST
TECHNIQUE: Multiplanar, multiecho pulse sequences of the brain and surrounding
structures were obtained without intravenous contrast. Angiographic
images of the head were obtained using MRV technique without
contrast.

[Series 3: DWI · axial · 3.0mm · 0.94mm/px · z∈[-108,+36]mm · 2 of 100 slices shown (1 of 2)]
[im 1/100]
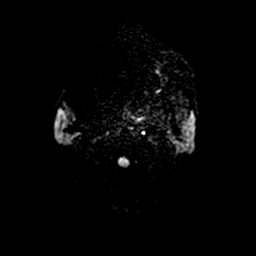
[im 100/100]
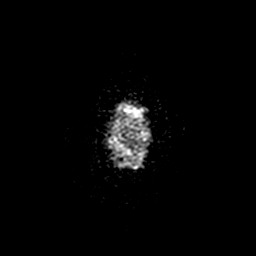

[Series 4: DWI · coronal · 4.0mm · 0.94mm/px · 1 of 72 slices shown (2 of 2)]
[im 1/72]
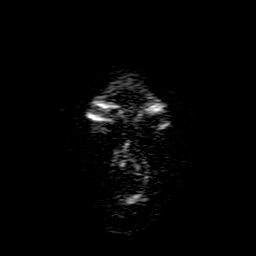

[Series 5: FLAIR · sagittal · 5.0mm · 0.47mm/px · 1 of 25 slices shown (1 of 3)]
[im 1/25]
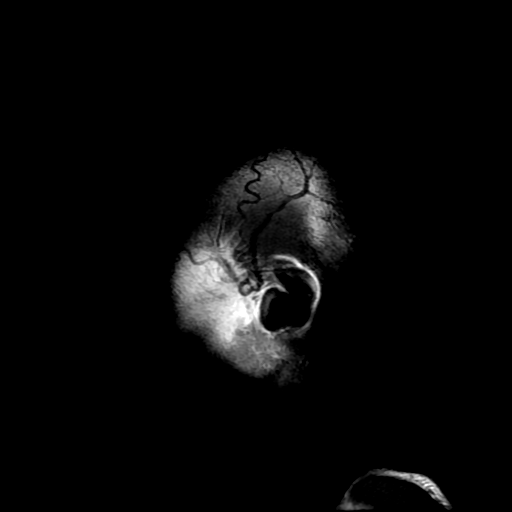

[Series 6: T2 · axial · 5.0mm · 0.47mm/px · 1 of 25 slices shown (1 of 2)]
[im 1/25]
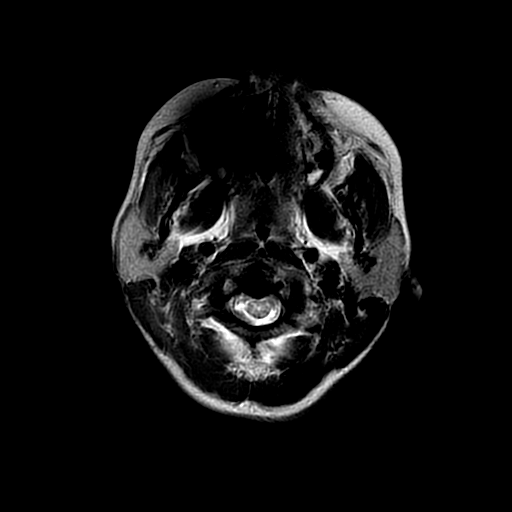

[Series 7: FLAIR · axial · 5.0mm · 0.47mm/px · 1 of 19 slices shown (2 of 3)]
[im 1/19]
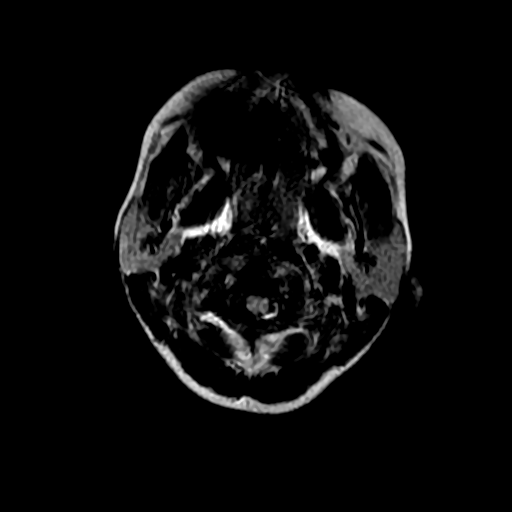

[Series 8: (person_name) · axial · 3.0mm · 0.47mm/px · z∈[-103,+43]mm · 3 of 100 slices shown]
[im 1/100]
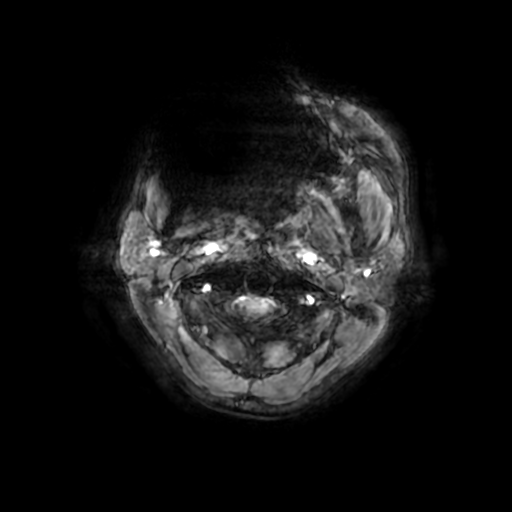
[im 50/100]
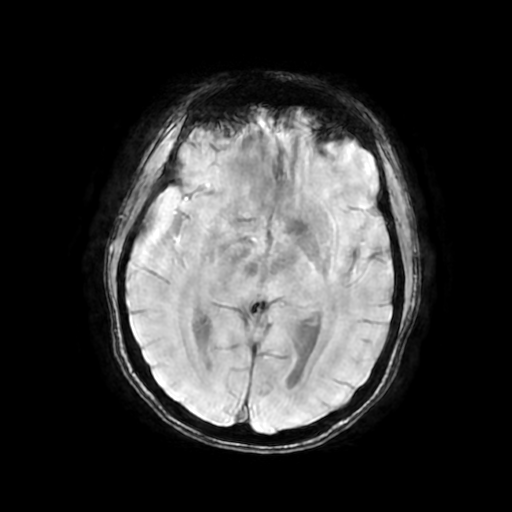
[im 100/100]
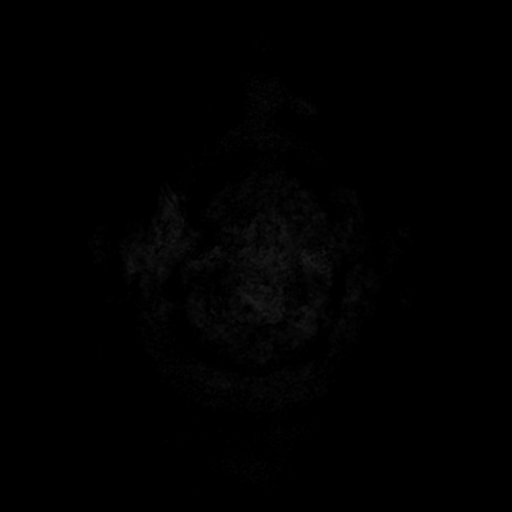

[Series 9: ax 3(person_name) · axial · 3.0mm · 0.94mm/px · 1 of 48 slices shown]
[im 1/48]
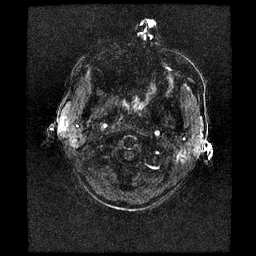

[Series 10: T2 · coronal · 5.0mm · 0.47mm/px · 1 of 30 slices shown (2 of 2)]
[im 1/30]
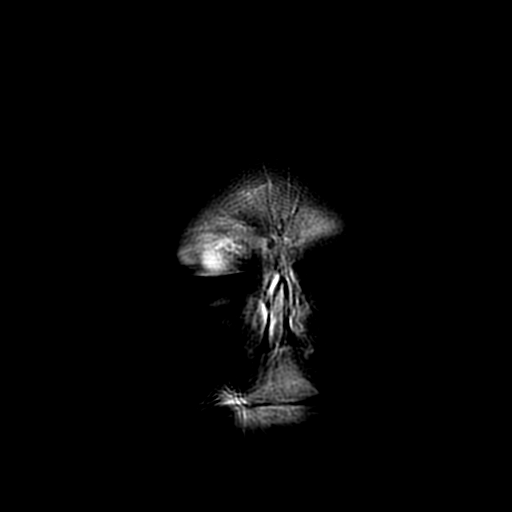

[Series 11: FLAIR · sagittal · 1.6mm · 0.49mm/px · 5 of 192 slices shown (3 of 3)]
[im 1/192]
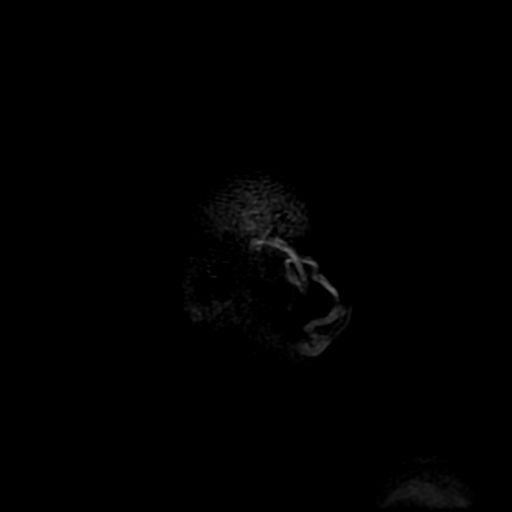
[im 48/192]
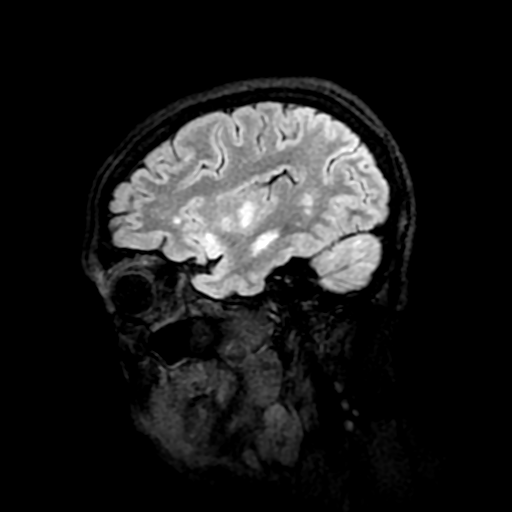
[im 96/192]
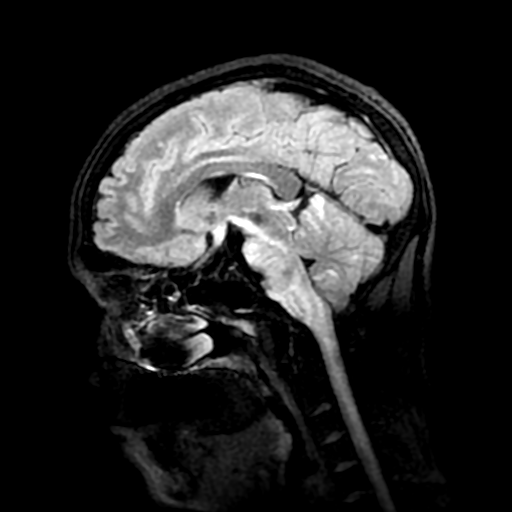
[im 144/192]
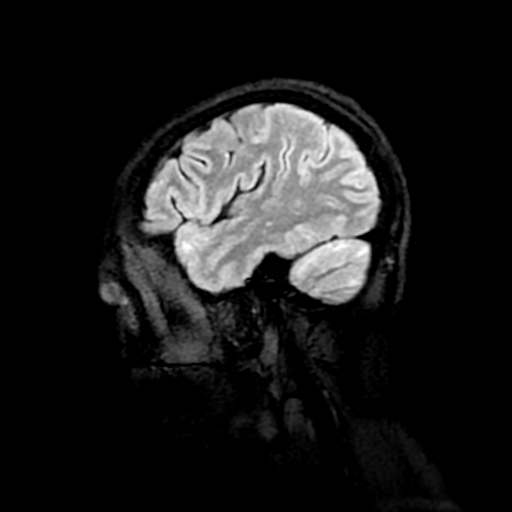
[im 192/192]
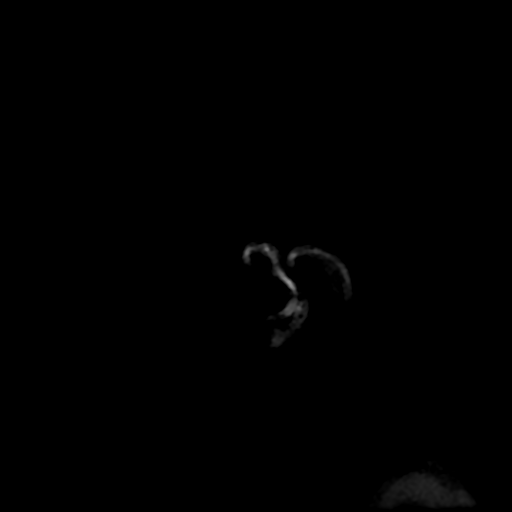

[Series 12: MRV · coronal · 1.5mm · 0.43mm/px · 3 of 109 slices shown]
[im 1/109]
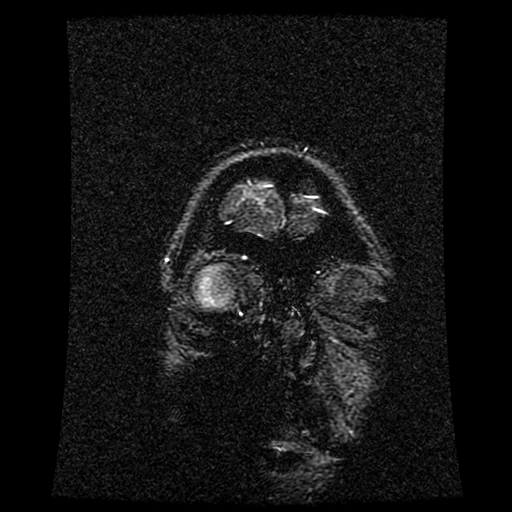
[im 55/109]
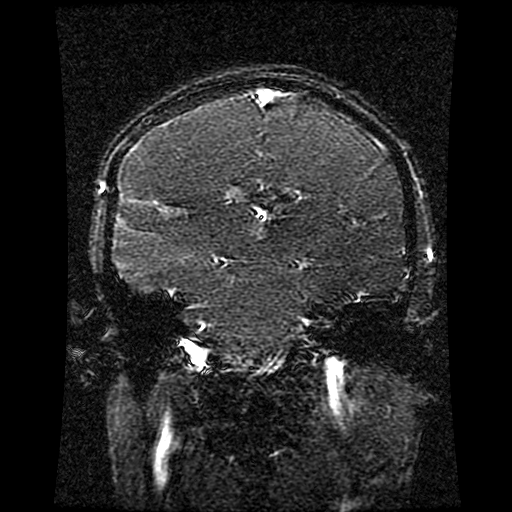
[im 109/109]
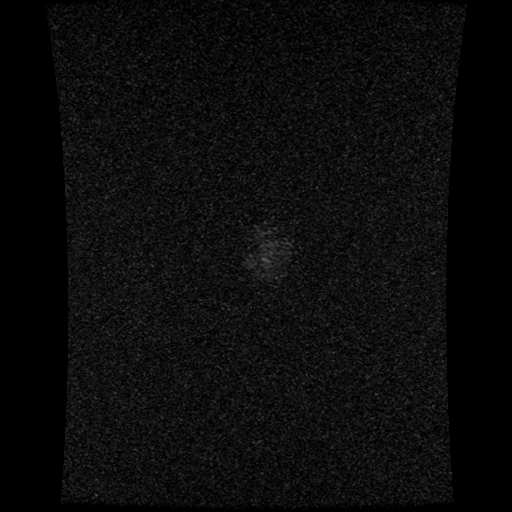

[Series 13: sag inhance (id) · sagittal · 1.8mm · 0.47mm/px · 2 of 336 slices shown]
[im 1/336]
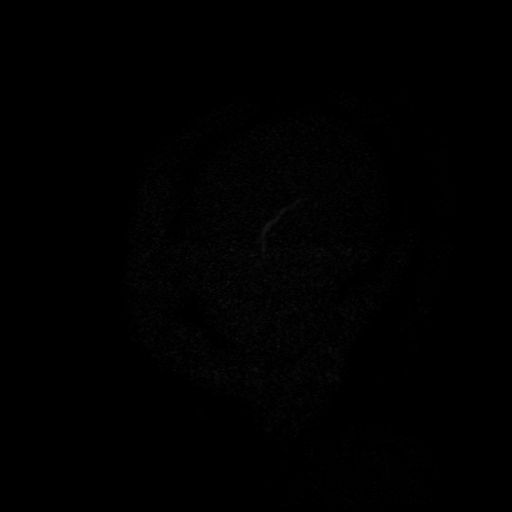
[im 42/336]
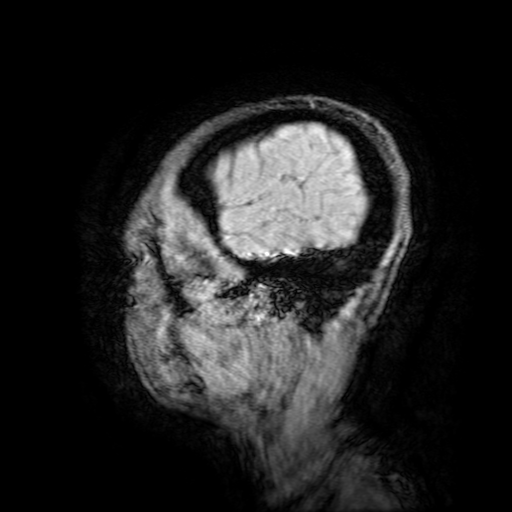

[Series 350: ADC · axial · 3.0mm · 0.94mm/px · 1 of 50 slices shown (1 of 2)]
[im 1/50]
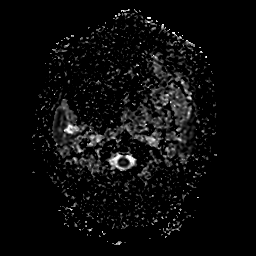

[Series 450: ADC · coronal · 4.0mm · 0.94mm/px · 1 of 36 slices shown (2 of 2)]
[im 1/36]
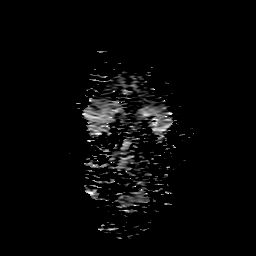

[23 of 48 positions shown; findings below may reference images not displayed]

FINDINGS: MRI HEAD FINDINGS

Brain: Focal T2 signal hyperintensity is evident in the midbrain and
pons on the left. This is a masslike lesion measuring 2.4 x 2.5 x
2.2 cm. There is facilitated diffusion associated with this lesion.

No acute infarct is present. Moderate periventricular white matter
changes are advanced for age. No other focal lesion is present.
Ventricles are normal size. There are some T2 signal changes within
the superior cerebellum.

Vascular: Flow is present in the major intracranial arteries.

Skull and upper cervical spine: The craniocervical junction is
normal. Upper cervical spine is within normal limits. Marrow signal
is unremarkable.

Sinuses/Orbits: The paranasal sinuses and mastoid air cells are
clear. The globes and orbits are within normal limits.

MRV HEAD FINDINGS

Dural sinuses are patent.  The right transverse sinus is dominant.
IMPRESSION: 1. T2 hyperintense 2.4 x 2.5 x 2.2 cm mass lesion centered in the
left midbrain and pons most consistent with a brainstem glioma.
2. Age advanced white matter disease. Question underlying vascular
pathology versus complicated migraine headaches.
